# Patient Record
Sex: Male | Born: 1945 | Race: White | Hispanic: No | Smoking: Former smoker
Health system: Southern US, Community
[De-identification: ages and names within clinical notes are randomized; demographics above are authoritative.]

## PROBLEM LIST (undated history)

## (undated) ENCOUNTER — Emergency Department (HOSPITAL_COMMUNITY): Admission: EM | Payer: Medicare Other | Source: Home / Self Care

## (undated) DIAGNOSIS — I10 Essential (primary) hypertension: Secondary | ICD-10-CM

## (undated) DIAGNOSIS — M129 Arthropathy, unspecified: Secondary | ICD-10-CM

## (undated) DIAGNOSIS — I4949 Other premature depolarization: Secondary | ICD-10-CM

## (undated) DIAGNOSIS — K439 Ventral hernia without obstruction or gangrene: Principal | ICD-10-CM

## (undated) DIAGNOSIS — M1990 Unspecified osteoarthritis, unspecified site: Secondary | ICD-10-CM

## (undated) DIAGNOSIS — J4489 Other specified chronic obstructive pulmonary disease: Secondary | ICD-10-CM

## (undated) DIAGNOSIS — J449 Chronic obstructive pulmonary disease, unspecified: Secondary | ICD-10-CM

## (undated) DIAGNOSIS — K219 Gastro-esophageal reflux disease without esophagitis: Secondary | ICD-10-CM

## (undated) DIAGNOSIS — I2510 Atherosclerotic heart disease of native coronary artery without angina pectoris: Secondary | ICD-10-CM

## (undated) DIAGNOSIS — E785 Hyperlipidemia, unspecified: Secondary | ICD-10-CM

## (undated) DIAGNOSIS — I252 Old myocardial infarction: Secondary | ICD-10-CM

## (undated) HISTORY — DX: Hyperlipidemia, unspecified: E78.5

## (undated) HISTORY — DX: Other specified chronic obstructive pulmonary disease: J44.89

## (undated) HISTORY — DX: Other premature depolarization: I49.49

## (undated) HISTORY — DX: Atherosclerotic heart disease of native coronary artery without angina pectoris: I25.10

## (undated) HISTORY — DX: Arthropathy, unspecified: M12.9

## (undated) HISTORY — DX: Essential (primary) hypertension: I10

## (undated) HISTORY — DX: Ventral hernia without obstruction or gangrene: K43.9

## (undated) HISTORY — PX: HERNIA REPAIR: SHX51

## (undated) HISTORY — PX: APPENDECTOMY: SHX54

## (undated) HISTORY — PX: CORONARY ANGIOPLASTY: SHX604

## (undated) HISTORY — DX: Old myocardial infarction: I25.2

## (undated) HISTORY — DX: Chronic obstructive pulmonary disease, unspecified: J44.9

---

## 1997-10-17 ENCOUNTER — Emergency Department (HOSPITAL_COMMUNITY): Admission: EM | Admit: 1997-10-17 | Discharge: 1997-10-17 | Payer: Self-pay | Admitting: Emergency Medicine

## 2000-07-08 ENCOUNTER — Encounter: Payer: Self-pay | Admitting: Emergency Medicine

## 2000-07-08 ENCOUNTER — Emergency Department (HOSPITAL_COMMUNITY): Admission: EM | Admit: 2000-07-08 | Discharge: 2000-07-08 | Payer: Self-pay | Admitting: Emergency Medicine

## 2001-06-18 ENCOUNTER — Encounter: Payer: Self-pay | Admitting: Emergency Medicine

## 2001-06-18 ENCOUNTER — Inpatient Hospital Stay (HOSPITAL_COMMUNITY): Admission: EM | Admit: 2001-06-18 | Discharge: 2001-06-20 | Payer: Self-pay | Admitting: Emergency Medicine

## 2001-06-28 ENCOUNTER — Encounter: Payer: Self-pay | Admitting: Emergency Medicine

## 2001-06-28 ENCOUNTER — Inpatient Hospital Stay (HOSPITAL_COMMUNITY): Admission: EM | Admit: 2001-06-28 | Discharge: 2001-07-02 | Payer: Self-pay | Admitting: Emergency Medicine

## 2001-07-06 ENCOUNTER — Emergency Department (HOSPITAL_COMMUNITY): Admission: EM | Admit: 2001-07-06 | Discharge: 2001-07-06 | Payer: Self-pay | Admitting: Emergency Medicine

## 2001-09-12 ENCOUNTER — Emergency Department (HOSPITAL_COMMUNITY): Admission: EM | Admit: 2001-09-12 | Discharge: 2001-09-12 | Payer: Self-pay | Admitting: Emergency Medicine

## 2003-11-25 ENCOUNTER — Encounter: Admission: RE | Admit: 2003-11-25 | Discharge: 2003-11-25 | Payer: Self-pay | Admitting: Internal Medicine

## 2006-03-27 HISTORY — PX: PTCA: SHX146

## 2006-06-24 ENCOUNTER — Emergency Department (HOSPITAL_COMMUNITY): Admission: EM | Admit: 2006-06-24 | Discharge: 2006-06-24 | Payer: Self-pay | Admitting: Gastroenterology

## 2007-03-02 ENCOUNTER — Inpatient Hospital Stay (HOSPITAL_COMMUNITY): Admission: EM | Admit: 2007-03-02 | Discharge: 2007-03-06 | Payer: Self-pay | Admitting: Family Medicine

## 2007-03-02 ENCOUNTER — Ambulatory Visit: Payer: Self-pay | Admitting: Internal Medicine

## 2007-04-05 ENCOUNTER — Ambulatory Visit: Payer: Self-pay | Admitting: Internal Medicine

## 2007-04-05 ENCOUNTER — Ambulatory Visit: Payer: Self-pay | Admitting: Cardiology

## 2007-04-05 LAB — CONVERTED CEMR LAB
BUN: 12 mg/dL (ref 6–23)
CO2: 28 meq/L (ref 19–32)
Calcium: 9 mg/dL (ref 8.4–10.5)
Creatinine, Ser: 0.7 mg/dL (ref 0.4–1.2)
Glucose, Bld: 99 mg/dL (ref 70–99)
Potassium: 3.7 meq/L (ref 3.5–5.1)

## 2007-04-23 ENCOUNTER — Ambulatory Visit: Payer: Self-pay | Admitting: Cardiology

## 2007-05-13 ENCOUNTER — Ambulatory Visit: Payer: Self-pay | Admitting: Internal Medicine

## 2007-06-07 ENCOUNTER — Emergency Department (HOSPITAL_COMMUNITY): Admission: EM | Admit: 2007-06-07 | Discharge: 2007-06-07 | Payer: Self-pay | Admitting: Family Medicine

## 2007-06-21 ENCOUNTER — Ambulatory Visit: Payer: Self-pay | Admitting: Cardiology

## 2007-06-21 ENCOUNTER — Emergency Department (HOSPITAL_COMMUNITY): Admission: EM | Admit: 2007-06-21 | Discharge: 2007-06-21 | Payer: Self-pay | Admitting: Family Medicine

## 2007-08-28 ENCOUNTER — Inpatient Hospital Stay (HOSPITAL_COMMUNITY): Admission: EM | Admit: 2007-08-28 | Discharge: 2007-08-30 | Payer: Self-pay | Admitting: Emergency Medicine

## 2007-09-13 ENCOUNTER — Ambulatory Visit: Payer: Self-pay | Admitting: Internal Medicine

## 2007-09-16 ENCOUNTER — Ambulatory Visit: Payer: Self-pay | Admitting: *Deleted

## 2007-09-19 ENCOUNTER — Encounter: Payer: Self-pay | Admitting: Family Medicine

## 2007-09-19 ENCOUNTER — Ambulatory Visit: Payer: Self-pay | Admitting: Internal Medicine

## 2007-09-19 LAB — CONVERTED CEMR LAB
Albumin: 4 g/dL (ref 3.5–5.2)
BUN: 17 mg/dL (ref 6–23)
Calcium: 8.8 mg/dL (ref 8.4–10.5)
Chloride: 105 meq/L (ref 96–112)
Glucose, Bld: 96 mg/dL (ref 70–99)
HDL: 55 mg/dL (ref >39)
Potassium: 4.3 meq/L (ref 3.5–5.3)
Triglycerides: 160 mg/dL — ABNORMAL HIGH (ref <150)

## 2007-09-20 ENCOUNTER — Ambulatory Visit (HOSPITAL_COMMUNITY): Admission: RE | Admit: 2007-09-20 | Discharge: 2007-09-20 | Payer: Self-pay | Admitting: Family Medicine

## 2007-10-28 ENCOUNTER — Ambulatory Visit: Payer: Self-pay | Admitting: Family Medicine

## 2007-11-08 ENCOUNTER — Ambulatory Visit: Payer: Self-pay | Admitting: Cardiology

## 2008-01-02 ENCOUNTER — Ambulatory Visit: Payer: Self-pay | Admitting: Internal Medicine

## 2008-01-17 ENCOUNTER — Ambulatory Visit: Payer: Self-pay | Admitting: Family Medicine

## 2008-02-17 ENCOUNTER — Ambulatory Visit: Payer: Self-pay | Admitting: Internal Medicine

## 2008-02-17 ENCOUNTER — Encounter: Payer: Self-pay | Admitting: Family Medicine

## 2008-02-17 LAB — CONVERTED CEMR LAB
ALT: 23 units/L (ref 0–53)
AST: 16 units/L (ref 0–37)
Basophils Absolute: 0 10*3/uL (ref 0.0–0.1)
Basophils Relative: 0 % (ref 0–1)
CO2: 23 meq/L (ref 19–32)
Cholesterol: 154 mg/dL (ref 0–200)
Creatinine, Ser: 0.9 mg/dL (ref 0.40–1.50)
Eosinophils Relative: 1 % (ref 0–5)
HCT: 43.6 % (ref 39.0–52.0)
Hemoglobin: 13.9 g/dL (ref 13.0–17.0)
MCHC: 31.9 g/dL (ref 30.0–36.0)
MCV: 89.9 fL (ref 78.0–100.0)
Monocytes Absolute: 0.7 10*3/uL (ref 0.1–1.0)
RDW: 15 % (ref 11.5–15.5)
Total Bilirubin: 0.3 mg/dL (ref 0.3–1.2)
Total CHOL/HDL Ratio: 3
VLDL: 28 mg/dL (ref 0–40)

## 2008-02-28 ENCOUNTER — Ambulatory Visit: Payer: Self-pay | Admitting: Internal Medicine

## 2008-03-04 ENCOUNTER — Ambulatory Visit: Payer: Self-pay | Admitting: Cardiology

## 2008-03-04 ENCOUNTER — Ambulatory Visit: Payer: Self-pay | Admitting: Cardiovascular Disease

## 2008-03-04 DIAGNOSIS — I10 Essential (primary) hypertension: Secondary | ICD-10-CM | POA: Insufficient documentation

## 2008-03-04 DIAGNOSIS — I2510 Atherosclerotic heart disease of native coronary artery without angina pectoris: Secondary | ICD-10-CM | POA: Insufficient documentation

## 2008-03-04 DIAGNOSIS — E785 Hyperlipidemia, unspecified: Secondary | ICD-10-CM

## 2008-05-07 ENCOUNTER — Ambulatory Visit: Payer: Self-pay | Admitting: Internal Medicine

## 2008-05-25 ENCOUNTER — Ambulatory Visit: Payer: Self-pay | Admitting: Family Medicine

## 2008-06-23 ENCOUNTER — Ambulatory Visit: Payer: Self-pay | Admitting: Cardiology

## 2008-08-27 ENCOUNTER — Telehealth (INDEPENDENT_AMBULATORY_CARE_PROVIDER_SITE_OTHER): Payer: Self-pay | Admitting: *Deleted

## 2008-09-04 ENCOUNTER — Telehealth: Payer: Self-pay | Admitting: Internal Medicine

## 2008-10-26 ENCOUNTER — Ambulatory Visit: Payer: Self-pay | Admitting: Internal Medicine

## 2008-10-29 ENCOUNTER — Ambulatory Visit: Payer: Self-pay | Admitting: Cardiology

## 2008-11-13 ENCOUNTER — Emergency Department (HOSPITAL_COMMUNITY): Admission: EM | Admit: 2008-11-13 | Discharge: 2008-11-14 | Payer: Self-pay | Admitting: Emergency Medicine

## 2008-11-18 DIAGNOSIS — Z9089 Acquired absence of other organs: Secondary | ICD-10-CM

## 2008-11-18 DIAGNOSIS — I4949 Other premature depolarization: Secondary | ICD-10-CM

## 2008-11-18 DIAGNOSIS — I252 Old myocardial infarction: Secondary | ICD-10-CM

## 2008-11-18 DIAGNOSIS — J449 Chronic obstructive pulmonary disease, unspecified: Secondary | ICD-10-CM

## 2008-11-18 DIAGNOSIS — M129 Arthropathy, unspecified: Secondary | ICD-10-CM | POA: Insufficient documentation

## 2008-11-18 DIAGNOSIS — Z9861 Coronary angioplasty status: Secondary | ICD-10-CM

## 2008-12-01 ENCOUNTER — Encounter: Payer: Self-pay | Admitting: Internal Medicine

## 2008-12-07 ENCOUNTER — Telehealth (INDEPENDENT_AMBULATORY_CARE_PROVIDER_SITE_OTHER): Payer: Self-pay | Admitting: *Deleted

## 2008-12-10 ENCOUNTER — Telehealth (INDEPENDENT_AMBULATORY_CARE_PROVIDER_SITE_OTHER): Payer: Self-pay | Admitting: *Deleted

## 2008-12-14 ENCOUNTER — Telehealth: Payer: Self-pay | Admitting: Internal Medicine

## 2009-01-21 ENCOUNTER — Ambulatory Visit: Payer: Self-pay | Admitting: Internal Medicine

## 2009-01-21 ENCOUNTER — Encounter: Payer: Self-pay | Admitting: Internal Medicine

## 2009-01-21 ENCOUNTER — Encounter: Payer: Self-pay | Admitting: Family Medicine

## 2009-01-21 LAB — CONVERTED CEMR LAB
Albumin: 3.8 g/dL (ref 3.5–5.2)
BUN: 9 mg/dL (ref 6–23)
Basophils Absolute: 0 10*3/uL (ref 0.0–0.1)
Basophils Relative: 0 % (ref 0–1)
CO2: 23 meq/L (ref 19–32)
Calcium: 8.9 mg/dL (ref 8.4–10.5)
Chloride: 109 meq/L (ref 96–112)
Cholesterol: 152 mg/dL (ref 0–200)
Creatinine, Ser: 0.91 mg/dL (ref 0.40–1.50)
Eosinophils Absolute: 0.1 10*3/uL (ref 0.0–0.7)
Eosinophils Relative: 2 % (ref 0–5)
Glucose, Bld: 100 mg/dL — ABNORMAL HIGH (ref 70–99)
HCT: 43.8 % (ref 39.0–52.0)
HDL: 58 mg/dL (ref >39)
Hemoglobin: 13.7 g/dL (ref 13.0–17.0)
MCHC: 31.3 g/dL (ref 30.0–36.0)
MCV: 92.2 fL (ref 78.0–100.0)
Monocytes Absolute: 0.7 10*3/uL (ref 0.1–1.0)
Neutro Abs: 3.6 10*3/uL (ref 1.7–7.7)
RDW: 16.3 % — ABNORMAL HIGH (ref 11.5–15.5)
Total CHOL/HDL Ratio: 2.6

## 2009-01-28 ENCOUNTER — Encounter: Payer: Self-pay | Admitting: Internal Medicine

## 2009-02-22 ENCOUNTER — Encounter: Payer: Self-pay | Admitting: Family Medicine

## 2009-02-22 ENCOUNTER — Ambulatory Visit: Payer: Self-pay | Admitting: Internal Medicine

## 2009-02-26 ENCOUNTER — Encounter (INDEPENDENT_AMBULATORY_CARE_PROVIDER_SITE_OTHER): Payer: Self-pay | Admitting: *Deleted

## 2009-02-26 ENCOUNTER — Ambulatory Visit: Payer: Self-pay | Admitting: Cardiology

## 2009-02-26 ENCOUNTER — Ambulatory Visit: Payer: Self-pay | Admitting: Internal Medicine

## 2009-03-10 ENCOUNTER — Telehealth: Payer: Self-pay | Admitting: Internal Medicine

## 2009-03-11 ENCOUNTER — Encounter: Payer: Self-pay | Admitting: Internal Medicine

## 2009-03-12 ENCOUNTER — Telehealth: Payer: Self-pay | Admitting: Internal Medicine

## 2009-04-01 ENCOUNTER — Ambulatory Visit: Payer: Self-pay | Admitting: Internal Medicine

## 2009-05-03 ENCOUNTER — Encounter: Payer: Self-pay | Admitting: Internal Medicine

## 2009-05-03 ENCOUNTER — Ambulatory Visit: Payer: Self-pay | Admitting: Internal Medicine

## 2009-05-07 ENCOUNTER — Telehealth: Payer: Self-pay | Admitting: Physician Assistant

## 2009-05-13 ENCOUNTER — Telehealth: Payer: Self-pay | Admitting: Internal Medicine

## 2009-05-14 ENCOUNTER — Ambulatory Visit: Payer: Self-pay | Admitting: Family Medicine

## 2009-05-17 ENCOUNTER — Ambulatory Visit (HOSPITAL_COMMUNITY): Admission: RE | Admit: 2009-05-17 | Discharge: 2009-05-17 | Payer: Self-pay | Admitting: Internal Medicine

## 2009-06-07 ENCOUNTER — Telehealth: Payer: Self-pay | Admitting: Internal Medicine

## 2009-06-16 ENCOUNTER — Ambulatory Visit: Payer: Self-pay | Admitting: Internal Medicine

## 2009-06-25 ENCOUNTER — Ambulatory Visit: Payer: Self-pay | Admitting: Cardiology

## 2009-08-27 ENCOUNTER — Telehealth: Payer: Self-pay | Admitting: Internal Medicine

## 2009-08-30 ENCOUNTER — Encounter: Payer: Self-pay | Admitting: Internal Medicine

## 2009-10-28 ENCOUNTER — Ambulatory Visit: Payer: Self-pay | Admitting: Cardiology

## 2009-11-09 ENCOUNTER — Telehealth: Payer: Self-pay | Admitting: Internal Medicine

## 2009-11-11 ENCOUNTER — Encounter: Payer: Self-pay | Admitting: Internal Medicine

## 2010-01-06 ENCOUNTER — Ambulatory Visit: Payer: Self-pay | Admitting: Internal Medicine

## 2010-01-06 ENCOUNTER — Telehealth: Payer: Self-pay | Admitting: Internal Medicine

## 2010-01-20 ENCOUNTER — Telehealth (INDEPENDENT_AMBULATORY_CARE_PROVIDER_SITE_OTHER): Payer: Self-pay | Admitting: *Deleted

## 2010-02-02 ENCOUNTER — Ambulatory Visit: Payer: Self-pay | Admitting: Pulmonary Disease

## 2010-02-18 ENCOUNTER — Telehealth: Payer: Self-pay | Admitting: Pulmonary Disease

## 2010-02-22 ENCOUNTER — Encounter: Payer: Self-pay | Admitting: Internal Medicine

## 2010-02-22 ENCOUNTER — Ambulatory Visit: Payer: Self-pay | Admitting: Cardiology

## 2010-03-01 ENCOUNTER — Telehealth: Payer: Self-pay | Admitting: Internal Medicine

## 2010-03-04 ENCOUNTER — Ambulatory Visit: Payer: Self-pay | Admitting: Pulmonary Disease

## 2010-03-16 ENCOUNTER — Telehealth: Payer: Self-pay | Admitting: Pulmonary Disease

## 2010-03-27 ENCOUNTER — Inpatient Hospital Stay (HOSPITAL_COMMUNITY)
Admission: EM | Admit: 2010-03-27 | Discharge: 2010-03-28 | Payer: Self-pay | Source: Home / Self Care | Attending: Internal Medicine | Admitting: Internal Medicine

## 2010-04-15 ENCOUNTER — Ambulatory Visit
Admission: RE | Admit: 2010-04-15 | Discharge: 2010-04-15 | Payer: Self-pay | Source: Home / Self Care | Attending: Internal Medicine | Admitting: Internal Medicine

## 2010-04-17 ENCOUNTER — Encounter: Payer: Self-pay | Admitting: Family Medicine

## 2010-04-22 NOTE — Discharge Summary (Signed)
  NAMERAFE, MACKOWSKI NO.:  [id number]  MEDICAL RECORD NO.:  [id number]          PATIENT TYPE:  INP  LOCATION:  2038                         FACILITY:  [facility]  PHYSICIAN:  Pricilla Riffle, MD, FACCDATE OF BIRTH:  1945-12-20  DATE OF ADMISSION:  03/27/2010 DATE OF DISCHARGE:  03/28/2010                              DISCHARGE SUMMARY   PROCEDURES:  Two-view chest x-ray.  PRIMARY FINAL DISCHARGE DIAGNOSIS:  Chest pain.  SECONDARY DIAGNOSES: 1. Status post non-ST-segment elevation myocardial infarction in 2008     with percutaneous transluminal coronary angioplasty and drug-     eluting stent to the right coronary artery. 2. Remote history of tobacco use. 3. Hypertension. 4. Dyslipidemia, on IMPROVE-IT study. 5. History of anemia. 6. Chronic obstructive pulmonary disease. 7. Family history of coronary artery disease.  TIME AT DISCHARGE:  32 minutes.  HOSPITAL COURSE:  [name] is a 65 year old male with a history of coronary artery disease.  He had chest pain and came to the hospital where he was admitted for further evaluation.  His cardiac enzymes were negative for MI.  He had a nosebleed, so his Plavix was held for a few days and his aspirin was decreased to 81 mg daily.  A lipid profile showed an HDL of 58 and an LDL of 64.  He had some minimal elevation in the CK-MBs, but his troponins were within normal limits.  The CK-MB index was within normal limits as well.  A chest x-ray showed hyperexpansion, but no acute disease.  On March 28, 2009, [name] was seen by Dr. Dietrich Pates.  His symptoms had improved.  She considered him stable for discharge on March 28, 2010, to follow up as an outpatient.  DISCHARGE INSTRUCTIONS: 1. His activity level is to be increased gradually. 2. He is encouraged to stick to a low-sodium, heart-healthy diet. 3. He is to follow up with Dr. Tenny Craw and we will contact him with an     appointment. 4. He is to follow up  with [facility] and Dr. Vassie Loll as needed or as     scheduled.  DISCHARGE MEDICATIONS: 1. Three study drugs for cholesterol daily as directed. 2. Prednisone, taper completed and no longer on. 3. Lisinopril 20 mg a day. 4. Lopressor 25 mg b.i.d. 5. Amlodipine 5 mg b.i.d. 6. Sublingual nitroglycerin p.r.n. 7. Aspirin 325 mg is discontinued. 8. Aspirin 81 mg a day. 9. Plavix 75 mg daily, hold until Friday April 01, 2009, and then     restart.     Theodore Demark, PA-C   ______________________________ Pricilla Riffle, MD, [facility]    RB/MEDQ  D:  03/28/2010  T:  03/29/2010  Job:  [id number]  cc:   Oretha Milch, MD [facility]  Electronically Signed by Theodore Demark PA-C on 04/04/2010 03:54:28 PM Electronically Signed by Dietrich Pates MD [facility] on 04/22/2010 07:54:55 PM

## 2010-04-24 LAB — CONVERTED CEMR LAB
BUN: 9 mg/dL (ref 6–23)
Basophils Relative: 0.3 % (ref 0.0–3.0)
CO2: 29 meq/L (ref 19–32)
Chloride: 103 meq/L (ref 96–112)
Creatinine, Ser: 0.9 mg/dL (ref 0.4–1.5)
Eosinophils Absolute: 0.1 10*3/uL (ref 0.0–0.7)
HCT: 46.1 % (ref 39.0–52.0)
Lymphs Abs: 3.7 10*3/uL (ref 0.7–4.0)
MCHC: 33.3 g/dL (ref 30.0–36.0)
MCV: 92.8 fL (ref 78.0–100.0)
Monocytes Absolute: 0.7 10*3/uL (ref 0.1–1.0)
Neutrophils Relative %: 51.4 % (ref 43.0–77.0)
Platelets: 238 10*3/uL (ref 150.0–400.0)
Potassium: 4.3 meq/L (ref 3.5–5.1)
RBC: 4.97 M/uL (ref 4.22–5.81)

## 2010-04-26 NOTE — Progress Notes (Signed)
  Phone Note From Other Clinic   Summary of Call: Rec'd call from Dr. Tenny Craw re: patient's COPD. She felt like he needed to be set up for PFTs. Patient followed at [facility]. [facility]. to contact patient to arrange follow up appt. Initial call taken by: Brynda Rim,  May 07, 2009 3:03 PM

## 2010-04-26 NOTE — Progress Notes (Signed)
Summary: out of plavix  Phone Note Call from Patient Call back at Home Phone [phone]   Caller: Patient Reason for Call: Talk to Nurse Summary of Call: pt is out of plavix Initial call taken by: Migdalia Dk,  June 07, 2009 10:25 AM  Follow-up for Phone Call        Patient should stay on Plavix.   Follow-up by: Sherrill Raring, MD, [facility],  June 08, 2009 6:17 PM     Appended Document: out of plavix Called [facility] at [phone] and they will send medication to this office. No answer at this patient's home today.  Appended Document: out of plavix Plavix has arrived, no answer at patient's home....will try again.   Appended Document: out of plavix Patient aware...he will pick up Plavix today.

## 2010-04-26 NOTE — Progress Notes (Signed)
Summary: pt needs plavix  Phone Note Call from Patient Call back at Home Phone [phone]   Caller: Patient Reason for Call: Talk to Nurse, Talk to Doctor Summary of Call: pt was suppose to get Rx for plavix in mail and he has not gotten it yet and was calling to f/u on it.  Initial call taken by: Omer Jack,  March 01, 2010 12:42 PM  Follow-up for Phone Call        pt is will to pick meds at the office. if we have not send it to him. Follow-up by: Roe Coombs,  March 01, 2010 12:45 PM  Additional Follow-up for Phone Call Additional follow up Details #1::        pt states [name] was going to mail him a prescription last week for Plavix and he hasn't received it yet, explained mail is slow form here give it another couple of days, he states if he doesn't receive it he will call us back Meredith Staggers, RN  March 01, 2010 12:57 PM

## 2010-04-26 NOTE — Assessment & Plan Note (Signed)
Summary: per check out/sf   History of Present Illness: [name] is a 65 year old with a history of CAD (s/p NSTEMI in 2008 with PTCA/DES to RCA.   He currenly denies chest pains.   I saw him last in the fall.  AT that time i increased his Norvasc to two times a day to hep with bp control I had also wanted him to have PFTs done.  Unfort the physician he saw at [facility] is no longer working there.  He has not followed up.  Allergies: No Known Drug Allergies  Physical Exam  Lungs:  Decreased airflow.  Rare wheeze Additional Exam:  HEENT:  Normocephalic, atraumatic. EOMI, PERRLA.  Neck: JVP is normal. No thyromegaly. No bruits.  Lungs: Decreased airflow.  Rare wheeze. Heart: Regular rate and rhythm. Normal S1, S2. No S3.   No significant murmurs. PMI not displaced.  Abdomen:  Supple, nontender. Normal bowel sounds. No masses. No hepatomegaly.  Extremities:   Good distal pulses throughout. No lower extremity edema.  Musculoskeletal :moving all extremities.  Neuro:   alert and oriented x3.    Impression & Recommendations:  Problem # 1:  MYOCARDIAL INFARCTION, HX OF (ICD-412) Stable cardiac status.  continue meds.  Problem # 2:  HYPERTENSION, BENIGN (ICD-401.1) Better bp control  continue meds.  Problem # 3:  HYPERLIPIDEMIA-MIXED (ICD-272.4) Excellent control  Problem # 4:  COPD (ICD-496) Will try to contact [facility] re PFTs.

## 2010-04-26 NOTE — Progress Notes (Signed)
Summary: pt needs meds called in  Phone Note Refill Request Message from:  Patient on brystol meyers  Refills Requested: Medication #1:  PLAVIX 75 MG TABS 1 tab once daily. pt needs refill  Initial call taken by: Omer Jack,  August 27, 2009 10:59 AM  Follow-up for Phone Call        talked with Malachi Bonds at [facility]--reordered Plavix-should be to Dr Tenny Craw in 5-7 days--pt aware     Appended Document: pt needs meds called in spoke with pt, plavix from [facility] placed at the front desk for pick up

## 2010-04-26 NOTE — Assessment & Plan Note (Signed)
Summary: copd/jd   Visit Type:  Initial Consult Copy to:  Dietrich Pates Primary Atlee Villers/Referring Jowanda Heeg:  Dr Johny Blamer  CC:  Pulmonary consult with PFT's today.  History of Present Illness: 64/M, ex smoker for evaluation of COPD 26 PY smoker , quit 2008, when he had an MI c/o phlegm in upper airway  - white phlegm, mucinex does not help, occ wheeze No recurrent chest colds Can work in the yard x  20 mins, walk around the house or in the store Put on advair by [employer], taking once daily  -does not have 'rescue ' inhaler PFT showed FEv1 23% with good response to bronchodilator improving to 30%, air trapping & decreased diffusion c/w emphysema. He has been on lisinopril x 1 yr   Preventive Screening-Counseling & Management  Alcohol-Tobacco     Smoking Status: quit     Packs/Day: 2.0     Year Started: 1962     Year Quit: 2008  Current Medications (verified): 1)  Lisinopril 20 Mg Tabs (Lisinopril) .... Take 1 Tablet By Mouth Once A Day 2)  Metoprolol Tartrate 25 Mg Tabs (Metoprolol Tartrate) .Marland Kitchen.. 1 By Mouth Two Times A Day 3)  Study Drug .... Daily 4)  Aspirin 325 Mg  Tabs (Aspirin) .Marland Kitchen.. 1 By Mouth Dialy 5)  Norvasc 5 Mg Tabs (Amlodipine Besylate) .Marland Kitchen.. 1 Tablet 2 Times Per Day 6)  Advair Diskus 500-50 Mcg/dose Aepb (Fluticasone-Salmeterol) .Marland Kitchen.. 1 Puff Daily 7)  Plavix 75 Mg Tabs (Clopidogrel Bisulfate) .Marland Kitchen.. 1 Tab Once Daily  Allergies (verified): No Known Drug Allergies  Past History:  Past Medical History: Last updated: 11/18/2008 Current Problems:  ARTHRITIS (ICD-716.90) MYOCARDIAL INFARCTION, HX OF (ICD-412) PREMATURE VENTRICULAR CONTRACTIONS, FREQUENT (ICD-427.69) COPD (ICD-496) HYPERLIPIDEMIA-MIXED (ICD-272.4) HYPERTENSION, BENIGN (ICD-401.1) CAD, NATIVE VESSEL (ICD-414.01)    Past Surgical History: Last updated: 11/18/2008 APPENDECTOMY, HX OF (ICD-V45.79) PERCUTANEOUS TRANSLUMINAL CORONARY ANGIOPLASTY, HX OF (ICD-V45.82)  Social History: Divorced    Tobacco Use - Former.  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  The patient denies anorexia, fever, weight loss, weight gain, vision loss, decreased hearing, hoarseness, chest pain, syncope, peripheral edema, prolonged cough, headaches, hemoptysis, abdominal pain, melena, hematochezia, severe indigestion/heartburn, hematuria, muscle weakness, suspicious skin lesions, difficulty walking, depression, unusual weight change, abnormal bleeding, enlarged lymph nodes, and angioedema.    Vital Signs:  Patient profile:   65 year old male Height:      68 inches (172.72 cm) Weight:      177 pounds (80.45 kg) BMI:     27.01 O2 Sat:      97 % on Room air Temp:     97.6 degrees F (36.44 degrees C) oral Pulse rate:   77 / minute BP sitting:   112 / 78  (left arm) Cuff size:   regular  Vitals Entered By: Michel Bickers CMA (February 02, 2010 4:15 PM)  O2 Sat at Rest %:  97 O2 Flow:  Room air CC: Pulmonary consult with PFT's today Comments Medications reviewed with patient Michel Bickers CMA  February 02, 2010 4:16 PM   Physical Exam  Additional Exam:  Gen. Pleasant, well-nourished, in no distress, normal affect ENT - no lesions, no post nasal drip Neck: No JVD, no thyromegaly, no carotid bruits Lungs: no use of accessory muscles, no dullness to percussion, diffuse exp  rhonchi  Cardiovascular: Rhythm regular, heart sounds  normal, no murmurs or gallops, no peripheral edema Abdomen: soft and non-tender, no hepatosplenomegaly, BS normal. Musculoskeletal: No deformities, no cyanosis or clubbing Neuro:  alert, non focal     Impression & Recommendations:  Problem # 1:  COPD (ICD-496) Increase advair 500/50 1 puff two times a day  - given reversibility on PFTs Take rescue inhaler 2 puffs as needed ONLY Take prednisone tabs - short course x 2 weeks to check for  reversibility He is resistant to starting pulm rehab program  -Start exercise program  Problem # 2:  CAD, NATIVE VESSEL (ICD-414.01)  If bronchospasm persists , considre selective beta blockade & substitute of ARb instead of lisinopril - clearly these drugs are more important for his CAD His updated medication list for this problem includes:    Lisinopril 20 Mg Tabs (Lisinopril) .Marland Kitchen... Take 1 tablet by mouth once a day    Metoprolol Tartrate 25 Mg Tabs (Metoprolol tartrate) .Marland Kitchen... 1 by mouth two times a day    Aspirin 325 Mg Tabs (Aspirin) .Marland Kitchen... 1 by mouth dialy    Norvasc 5 Mg Tabs (Amlodipine besylate) .Marland Kitchen... 1 tablet 2 times per day    Plavix 75 Mg Tabs (Clopidogrel bisulfate) .Marland Kitchen... 1 tab once daily  Orders: Consultation Level IV (91478)  Medications Added to Medication List This Visit: 1)  Advair Diskus 500-50 Mcg/dose Aepb (Fluticasone-salmeterol) .Marland Kitchen.. 1 puff two times a day 2)  Prednisone 10 Mg Tabs (Prednisone) .... Take 2 tabs once daily with food x 7 days, then 1 tab once daily x 7days  Patient Instructions: 1)  Copy sent to: Dr Tenny Craw, Dr Tiburcio Pea 2)  Please schedule a follow-up appointment in 4 weeks. 3)  Increase advair 500/50 1 puff two times a day  4)  Take rescue inhaler 2 puffs as needed ONLY- sample 5)  Take prednisone tabs as directed 6)  Start exercise program Prescriptions: PREDNISONE 10 MG TABS (PREDNISONE) take 2 tabs once daily with food x 7 days, then 1 tab once daily x 7days  #21 x 0   Entered and Authorized by:   Comer Locket Vassie Loll MD   Signed by:   Comer Locket Vassie Loll MD on 02/02/2010   Method used:   Electronically to        [facility] [address] [phone][facility] (retail)       [address]       [place], [place]  [address]       Ph: [phone] or [phone]       Fax: [phone]   RxID:   [id number]

## 2010-04-26 NOTE — Miscellaneous (Signed)
  Clinical Lists Changes  Medications: Rx of PLAVIX 75 MG TABS (CLOPIDOGREL BISULFATE) 1 tab once daily;  #90 x 3;  Signed;  Entered by: Layne Benton, RN, BSN;  Authorized by: Sherrill Raring, MD, [facility];  Method used: Print then Give to Patient    Prescriptions: PLAVIX 75 MG TABS (CLOPIDOGREL BISULFATE) 1 tab once daily  #90 x 3   Entered by:   Layne Benton, RN, BSN   Authorized by:   Sherrill Raring, MD, [facility]   Signed by:   Layne Benton, RN, BSN on 02/22/2010   Method used:   Print then Give to Patient   RxID:   [id number]

## 2010-04-26 NOTE — Progress Notes (Signed)
Summary: refill on norvasc  Phone Note Refill Request Message from:  Patient on January 06, 2010 1:57 PM  Refills Requested: Medication #1:  NORVASC 5 MG TABS 1 tablet 2 times per day [BMN] pt [facility]. pt wants the nurse to [phone] and refill gernic of norvasc.  Initial call taken by: Roe Coombs,  January 06, 2010 1:56 PM  Follow-up for Phone Call        Called pharmacy and asked them to fill Norvasc with generic.  Layne Benton, RN, BSN  January 06, 2010 5:04 PM      Appended Document: refill on norvasc Patient aware of results.  Appended Document: refill on norvasc Called patient ...he is aware that [facility] and [facility] generic Norvasc...he will pick up tomorrow.

## 2010-04-26 NOTE — Miscellaneous (Signed)
Summary: Orders Update pft charges  Clinical Lists Changes  Orders: Added new Service order of Carbon Monoxide diffusing w/capacity (94720) - Signed Added new Service order of Lung Volumes (94240) - Signed Added new Service order of Spirometry (Pre & Post) (94060) - Signed 

## 2010-04-26 NOTE — Progress Notes (Signed)
Summary: Flu-like Symptoms  Phone Note Call from Patient Call back at Home Phone [phone]   Caller: Patient Reason for Call: Talk to Nurse Complaint: Cough/Sore throat, Headache, Nausea/Vomiting/Diarrhea Details for Reason: Per pt calling, c/o fluid build up x 3 days, no pcp was not contacted. if dr. Tenny Craw can called in a rx at drug store.  Initial call taken by: Lorne Skeens,  May 13, 2009 8:08 AM  Follow-up for Phone Call        I spoke with the pt and he is c/o low grade fever, nausea, vomiting, diarrhea, cough and chest congestion.  The pt wanted to know if Dr Tenny Craw would call in an antibiotic for his symptoms.  I told the pt that he most likely has the flu and needs to follow-up with his PCP.  The pt does have an appt with [facility] tomorrow.  I asked the pt to call [facility] and see if he could be seen today.  Pt agrees with plan.  Follow-up by: Julieta Gutting, RN, BSN,  May 13, 2009 8:23 AM

## 2010-04-26 NOTE — Medication Information (Signed)
Summary: [facility]   Imported By: Roderic Ovens 09/20/2009 08:51:51  _____________________________________________________________________  External Attachment:    Type:   Image     Comment:   External Document  Appended Document: [employer] [facility] Refill Called patient and advised him to pick up sample bottle of Plavix.

## 2010-04-26 NOTE — Progress Notes (Signed)
Summary: flu shot  Phone Note Call from Patient Call back at Home Phone [phone] Call back at [phone]   Caller: Patient Reason for Call: Talk to Nurse Details for Reason: Per pt calling, wants to know  if we are doing the flu clinic this year. Initial call taken by: Lorne Skeens,  November 09, 2009 10:07 AM  Follow-up for Phone Call        NA home phone. Cell phone incorrect number. Will try again. Pt call back [phone] Judie Grieve  November 09, 2009 2:35 PM Follow-up by: Suzan Garibaldi RN  Additional Follow-up for Phone Call Additional follow up Details #1::        Called patient NA. Additional Follow-up by: Suzan Garibaldi RN    Additional Follow-up for Phone Call Additional follow up Details #2::    Called patient back and advised him that we are not offering a flu shot clinic this year. He will get it from his primary care doctor. He asked that I refill his Plavix. Called [employer] and they will mail a new stock bottle to this office. He has 1 refill left and we will have to reapply the second week of November with a new application and financial statement.   Follow-up by: Suzan Garibaldi RN

## 2010-04-26 NOTE — Medication Information (Signed)
Summary: [facility] [employer] [facility]ill   [facility] Refill   Imported By: Roderic Ovens 11/23/2009 15:23:51  _____________________________________________________________________  External Attachment:    Type:   Image     Comment:   External Document  Appended Document: rov/sl Reviewed with research nurses.  Lipids are being followed closely as part of research protocol.  we are blinded but he is being followed so that LDL is at least less than 119.

## 2010-04-26 NOTE — Assessment & Plan Note (Signed)
Summary: [facility]   Visit Type:  Follow-up Primary Provider:  [facility]  CC:  no complaints.  History of Present Illness: [name] is a 65 year old with a history of CAD (s/p NSTEMI in 2008 iwth PTCA/DES to RCA).  Also a histoyr of hypertenison, dyslipidemia, COPD.  See second clinic note that was entered for 05/03/09  Current Medications (verified): 1)  Lisinopril 20 Mg Tabs (Lisinopril) .... Take 1 Tablet By Mouth Once A Day 2)  Metoprolol Tartrate 25 Mg Tabs (Metoprolol Tartrate) .Marland Kitchen.. 1 By Mouth Two Times A Day 3)  Study Drug .... Daily 4)  Aspirin 325 Mg  Tabs (Aspirin) .Marland Kitchen.. 1 By Mouth Dialy 5)  Oyster Calcium 500 Mg Tabs (Oyster Shell) .Marland Kitchen.. 1 By Mouth Daily 6)  Protonix 40 Mg Tbec (Pantoprazole Sodium) .Marland Kitchen.. 1 By Mouth Dialy 7)  Norvasc 5 Mg Tabs (Amlodipine Besylate) .Marland Kitchen.. 1 Tablet 2 Times Per Day 8)  Advair Diskus .... Daily 9)  Spiriva Inhaler .... Daily 10)  Plavix 75 Mg Tabs (Clopidogrel Bisulfate) .Marland Kitchen.. 1 Tab Once Daily  Allergies (verified): No Known Drug Allergies  Past History:  Past Medical History: Last updated: 11/18/2008 Current Problems:  ARTHRITIS (ICD-716.90) MYOCARDIAL INFARCTION, HX OF (ICD-412) PREMATURE VENTRICULAR CONTRACTIONS, FREQUENT (ICD-427.69) COPD (ICD-496) HYPERLIPIDEMIA-MIXED (ICD-272.4) HYPERTENSION, BENIGN (ICD-401.1) CAD, NATIVE VESSEL (ICD-414.01)    Vital Signs:  Patient profile:   65 year old male Height:      67 inches Weight:      182 pounds Pulse rate:   60 / minute BP sitting:   140 / 67  (left arm) Cuff size:   large  Vitals Entered By: Burnett Kanaris, CNA (May 03, 2009 9:36 AM)

## 2010-04-26 NOTE — Progress Notes (Signed)
Summary: BP READINGS  Phone Note Outgoing Call   Call placed by: Suzan Garibaldi RN  Follow-up for Phone Call        Patient reported that his BP on 10/18 was 144/80 and on 10/23 it was 124/66. Dr. Tenny Craw has advised that he does not need to make any medication changes. Patient is aware.  Layne Benton, RN, BSN  January 20, 2010 7:30 PM

## 2010-04-26 NOTE — Progress Notes (Signed)
Summary: ? letter for senior center  ---- Converted from flag ---- ---- 02/02/2010 5:19 PM, Comer Locket. Vassie Loll MD wrote: He can start & we will provide letter if needed  ---- 02/02/2010 4:43 PM, Zackery Barefoot CMA wrote: Pt states you told him that he will need a letter to start the senior citizen exercise program. ------------------------------  Phone Note Outgoing Call Call back at [facility] Phone [phone]   Summary of Call: ATC pt to inform of above,  no answer. Will sign off on this note wait on pt to call when/if  he needs a letter. Zackery Barefoot CMA  February 18, 2010 2:26 PM

## 2010-04-28 NOTE — Assessment & Plan Note (Signed)
Summary: rov/sl   Visit Type:  Follow-up Primary Provider:  Dr Johny Blamer  CC:  sob.  History of Present Illness: [name] is a 65 year old with a history of CAD (s/p NSTEMI in 2008 with PTCA/DES to RCA.   He currenly denies chest pains.   I saw him last in the fall.  AT that time i increased his Norvasc to two times a day to hep with bp control I had also wanted him to have PFTs done.  Unfort [facility] no [facility] working [facility].  He has not followed up. I saw him in February.  since seen he has noted on significant chest pains.  Breathing is about the same.  He still gets SOB with exertion.    Claims he just ran out of meds.  Took this AM Now has medicare.  Is not followed at [facility].  Current Medications (verified): 1)  Lisinopril 20 Mg Tabs (Lisinopril) .... Take 1 Tablet By Mouth Once A Day 2)  Metoprolol Tartrate 25 Mg Tabs (Metoprolol Tartrate) .Marland Kitchen.. 1 By Mouth Two Times A Day 3)  Study Drug .... Daily 4)  Aspirin 325 Mg  Tabs (Aspirin) .Marland Kitchen.. 1 By Mouth Dialy 5)  Norvasc 5 Mg Tabs (Amlodipine Besylate) .Marland Kitchen.. 1 Tablet 2 Times Per Day 6)  Advair Diskus .... Daily 7)  Plavix 75 Mg Tabs (Clopidogrel Bisulfate) .Marland Kitchen.. 1 Tab Once Daily  Allergies (verified): No Known Drug Allergies  Past History:  Past medical, surgical, family and social histories (including risk factors) reviewed, and no changes noted (except as noted below).  Past Medical History: Reviewed history from 11/18/2008 and no changes required. Current Problems:  ARTHRITIS (ICD-716.90) MYOCARDIAL INFARCTION, HX OF (ICD-412) PREMATURE VENTRICULAR CONTRACTIONS, FREQUENT (ICD-427.69) COPD (ICD-496) HYPERLIPIDEMIA-MIXED (ICD-272.4) HYPERTENSION, BENIGN (ICD-401.1) CAD, NATIVE VESSEL (ICD-414.01)    Past Surgical History: Reviewed history from 11/18/2008 and no changes required. APPENDECTOMY, HX OF (ICD-V45.79) PERCUTANEOUS TRANSLUMINAL CORONARY ANGIOPLASTY, HX OF  (ICD-V45.82)  Family History: Reviewed history from 03/04/2008 and no changes required. Mother:CABG in her 9's  Social History: Reviewed history from 03/04/2008 and no changes required. Divorced  Tobacco Use - Former.  Alcohol Use - no Regular Exercise - no  Review of Systems       All systems reviewed.  Neg to above.  Vital Signs:  Patient profile:   65 year old male Height:      67 inches Weight:      177 pounds BMI:     27.82 Pulse rate:   77 / minute BP sitting:   156 / 73  (left arm) Cuff size:   large  Vitals Entered By: Burnett Kanaris, CNA (January 06, 2010 10:19 AM)  Physical Exam  Additional Exam:  Patient is in NAD HEENT:  Normocephalic, atraumatic. EOMI, PERRLA.  Neck: JVP is normal. No thyromegaly. No bruits.  Lungs: Decreased airflow.  No rales.  Mild exp wheeze Heart: Regular rate and rhythm. Normal S1, S2. No S3.   No significant murmurs. PMI not displaced.  Abdomen:  Supple, nontender. Normal bowel sounds. No masses. No hepatomegaly.  Distended   Extremities:   Good distal pulses throughout. No lower extremity edema.  Musculoskeletal :moving all extremities.  Neuro:   alert and oriented x3.    EKG  Procedure date:  01/06/2010  Findings:      NSR.  PACs 79 bpm.  Impression & Recommendations:  Problem # 1:  PERCUTANEOUS TRANSLUMINAL CORONARY ANGIOPLASTY, HX OF (ICD-V45.82)  No symptoms to suggest ischemia Patient is s/p TAXUS stent to  RCA.  On review of note from procedure Dr. Juanda Chance notes a 50% narrowing in vessel just proximal to the proximal edge of stent.  Given this I would keep on plavix.  Problem # 2:  COPD (ICD-496) Will refer to pulmonary  Problem # 3:  HYPERLIPIDEMIA-MIXED (ICD-272.4) IN research protocol  Awaiting call back on labs.  Problem # 4:  HYPERTENSION, BENIGN (ICD-401.1) BP is not good today.  180/80 on my check   patient claims he has taken today.  Will refill.  Is getting cuff or taking it more often   If remains high  will  consider renal art eval.  (normal USN in 2009 of aorta.  Not vasc study) His updated medication list for this problem includes:    Lisinopril 20 Mg Tabs (Lisinopril) .Marland Kitchen... Take 1 tablet by mouth once a day    Metoprolol Tartrate 25 Mg Tabs (Metoprolol tartrate) .Marland Kitchen... 1 by mouth two times a day    Aspirin 325 Mg Tabs (Aspirin) .Marland Kitchen... 1 by mouth dialy    Norvasc 5 Mg Tabs (Amlodipine besylate) .Marland Kitchen... 1 tablet 2 times per day  Other Orders: EKG w/ Interpretation (93000) TLB-BMP (Basic Metabolic Panel-BMET) (80048-METABOL) TLB-CBC Platelet - w/Differential (85025-CBCD) Pulmonary Function Test (PFT) Pulmonary Referral (Pulmonary)  Patient Instructions: 1)  Your physician recommends that you return for lab work in: bmet and cbc today  We will call you with lab results. 2)  Your physician has recommended that you have a pulmonary function test.  Pulmonary Function Tests are a group of tests that measure how well air moves in and out of your lungs. 3)  You have been referred to Pulmonary Dept. 4)  Your physician recommends that you schedule a follow-up appointment in: 2 weeks for BP check with nurse 5)  Your physician wants you to follow-up in:  6 months with Dr.Oluwatobiloba Martin  You will receive a reminder letter in the mail two months in advance. If you don't receive a letter, please call our office to schedule the follow-up appointment. Prescriptions: NORVASC 5 MG TABS (AMLODIPINE BESYLATE) 1 tablet 2 times per day Brand medically necessary #60 x 3   Entered by:   Layne Benton, RN, BSN   Authorized by:   Sherrill Raring, MD, [facility]   Signed by:   Layne Benton, RN, BSN on 01/06/2010   Method used:   [facility] [address] [phone]* (retail)       [address]       [place], [place]  [address]       Ph: [phone] or [phone]       Fax: [phone]   RxID:   [id number] METOPROLOL TARTRATE 25 MG TABS (METOPROLOL TARTRATE) 1 by mouth two  times a day  #60 x 6   Entered by:   Layne Benton, RN, BSN   Authorized by:   Sherrill Raring, MD, [facility]   Signed by:   Layne Benton, RN, BSN on 01/06/2010   Method used:   [facility]ronically to        [facility] [address] [phone]* (retail)       [address]       [place], [place]  [address]       Ph: [phone] or [phone]  Fax: [phone]   RxID:   [id number] LISINOPRIL 20 MG TABS (LISINOPRIL) Take 1 tablet by mouth once a day  #30 x 6   Entered by:   Layne Benton, RN, BSN   Authorized by:   Sherrill Raring, MD, [facility]   Signed by:   Layne Benton, RN, BSN on 01/06/2010   Method used:   [facility]ronically to        [facility] [address] [phone]* (retail)       [address]       [place], [place]  [address]       Ph: [phone] or [phone]       Fax: [phone]   RxID:   [id number]   Appended Document: rov/sl Reviewed with research nurses.  Lipids are being followed closely as part of research protocol.  we are blinded but he is being followed so that LDL is at least less than 010.  Appended Document: rov/sl HAS F/U ON  04/15/10  @ 11:30./CY

## 2010-04-28 NOTE — Assessment & Plan Note (Addendum)
Summary: EPH   Visit Type:  Follow-up Referring Provider:  Dietrich Pates Primary Provider:  Dr Johny Blamer  CC:  no compliants.  History of Present Illness: [name] is a 65 year old with a history of CAD (s/p NSTEMI in 2008 with PTCA/DES to RCA.  He was last in [facility] in the fall.  He was recently discharged from [facility].  He was admitted with a nose bleed and complained of chest pain.  He ruled out for MI.  Plavix was held for a few  days.  He has humidified his home and used saline spray.  He has had no more bleeds.  SInce d/c he has done well.  He denies chest pain.  Breathnig is OK . No bloody nose  Problems Prior to Update: 1)  Arthritis  (ICD-716.90) 2)  Percutaneous Transluminal Coronary Angioplasty, Hx of  (ICD-V45.82) 3)  Myocardial Infarction, Hx of  (ICD-412) 4)  Appendectomy, Hx of  (ICD-V45.79) 5)  Premature Ventricular Contractions, Frequent  (ICD-427.69) 6)  COPD  (ICD-496) 7)  Hyperlipidemia-mixed  (ICD-272.4) 8)  Hypertension, Benign  (ICD-401.1) 9)  Cad, Native Vessel  (ICD-414.01)  Current Medications (verified): 1)  Lisinopril 20 Mg Tabs (Lisinopril) .... Take 1 Tablet By Mouth Once A Day 2)  Metoprolol Tartrate 25 Mg Tabs (Metoprolol Tartrate) .Marland Kitchen.. 1 By Mouth Two Times A Day 3)  Study Drug .... Three Tablets A Day 4)  Aspir-Low 81 Mg Tbec (Aspirin) .... Take 1 Tablet By Mouth Once A Day 5)  Norvasc 5 Mg Tabs (Amlodipine Besylate) .Marland Kitchen.. 1 Tablet 2 Times Per Day 6)  Advair Diskus 500-50 Mcg/dose Aepb (Fluticasone-Salmeterol) .Marland Kitchen.. 1 Puff Two Times A Day 7)  Plavix 75 Mg Tabs (Clopidogrel Bisulfate) .Marland Kitchen.. 1 Tab Once Daily  Allergies (verified): No Known Drug Allergies  Past History:  Family History: Last updated: 03/04/2008 Mother:CABG in her 90's  Social History: Last updated: 02/02/2010 Divorced  Tobacco Use - Former.  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  Past Medical History: Reviewed history from 11/18/2008 and no changes required. Current Problems:  ARTHRITIS (ICD-716.90) MYOCARDIAL INFARCTION, HX OF (ICD-412) PREMATURE VENTRICULAR CONTRACTIONS, FREQUENT (ICD-427.69) COPD (ICD-496) HYPERLIPIDEMIA-MIXED (ICD-272.4) HYPERTENSION, BENIGN (ICD-401.1) CAD, NATIVE VESSEL (ICD-414.01)    Past Surgical History: Reviewed history from 11/18/2008 and no changes required. APPENDECTOMY, HX OF (ICD-V45.79) PERCUTANEOUS TRANSLUMINAL CORONARY ANGIOPLASTY, HX OF (ICD-V45.82)  Family History: Reviewed history from 03/04/2008 and no changes required. Mother:CABG in her 19's  Social History: Reviewed history from 02/02/2010 and no changes required. Divorced  Tobacco Use - Former.  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  Neg to the above except as noted.  Vital Signs:  Patient profile:   65 year old male Height:      68 inches Weight:      175.50 pounds BMI:     26.78 Pulse rate:   44 / minute BP sitting:   116 / 68  (right arm) Cuff size:   regular  Vitals Entered By: Micki Riley CNA (April 15, 2010 11:16 AM)  Physical Exam  Additional Exam:  Patient is in NAD HEENT:  Normocephalic, atraumatic. EOMI, PERRLA.  Neck: JVP is normal. No thyromegaly. No bruits.  Lungs: clear to auscultation. No rales no wheezes.  Heart: Regular rate and rhythm. Normal S1, S2. No S3.   No significant murmurs. PMI not displaced.  Abdomen:  Supple, nontender. Normal bowel sounds. No masses. No hepatomegaly.  Extremities:   Good distal pulses throughout. No lower extremity edema.  Musculoskeletal :moving all extremities.    Neuro:   alert and oriented x3.    Impression & Recommendations:  Problem # 1:  CAD, NATIVE VESSEL (ICD-414.01) No evidence of ischemia.  COntinue meds.  Problem # 2:  HYPERTENSION, BENIGN (ICD-401.1) Continue meds. His updated medication list for this problem includes:    Lisinopril 20 Mg Tabs (Lisinopril) .Marland Kitchen... Take 1 tablet by mouth once a day    Metoprolol Tartrate 25 Mg Tabs (Metoprolol tartrate) .Marland Kitchen... 1 by mouth two times a day    Aspir-low 81 Mg Tbec (Aspirin) .Marland Kitchen... Take 1 tablet by mouth once a day    Norvasc 5 Mg Tabs (Amlodipine besylate) .Marland Kitchen... 1 tablet 2 times per day  Problem # 3:  HYPERLIPIDEMIA-MIXED (ICD-272.4) Continue study  Patient Instructions: 1)  Your physician wants you to follow-up in: Fall of 2012   You will receive a reminder letter in the mail two months in advance. If you don't receive a letter, please call our office to schedule the follow-up appointment. Prescriptions: NORVASC 5 MG TABS (AMLODIPINE BESYLATE) 1 tablet 2 times per day Brand medically necessary #60 x 11   Entered by:   Layne Benton, RN, BSN   Authorized by:   Sherrill Raring, MD, [facility]   Signed by:   Layne Benton, RN, BSN on 04/15/2010   Method used:   [facility] to        [facility] [address] [phone]* (retail)       [address]       [place], [place]  [address]       Ph: [phone] or [phone]       Fax: [phone]   RxID:   [id number]   Appended Document: EPH Called pharm and advised generic Norvasc OK with Dr.Alysa Duca and patient is aware.

## 2010-04-28 NOTE — Progress Notes (Signed)
----   Converted from flag ---- ---- 03/05/2010 9:07 AM, Comer Locket. Vassie Loll MD wrote: pl let him know - Unfortunately we do not have ongoing trials for him. We have one ongoing for lesser level of copd. we will keep him in mind if any come up ------------------------------  pt informed. Zackery Barefoot CMA  March 16, 2010 3:21 PM

## 2010-04-28 NOTE — Assessment & Plan Note (Signed)
Summary: 4 weeks/ mbw   Visit Type:  Follow-up Copy to:  Dietrich Pates Primary Provider/Referring Provider:  Dr Johny Blamer  CC:  Pt here for follow up. Pt c/o increased SOB with activity.  History of Present Illness: 65/M, ex smoker for FU of severe COPD - gold stg iV 46 PY smoker , quit 2008, when he had an MI Can work in the yard x  20 mins, walk around the house or in the store Put on advair by [employer], taking once daily  -does not have 'rescue ' inhaler PFT showed FEv1 23% with good response to bronchodilator improving to 30%, air trapping & decreased diffusion c/w emphysema. He has been on lisinopril x 1 yr  March 04, 2010 1:41 PM  Breathing better, wheezing gone with prednisone, still some dyspnea He is interested in trials if any ongoing, does not want to participate in rehab however, wants to do it himself, wants [place] to assume care for copd   Preventive Screening-Counseling & Management  Alcohol-Tobacco     Smoking Status: quit     Packs/Day: 2.0     Year Started: 1962     Year Quit: 2008  Current Medications (verified): 1)  Lisinopril 20 Mg Tabs (Lisinopril) .... Take 1 Tablet By Mouth Once A Day 2)  Metoprolol Tartrate 25 Mg Tabs (Metoprolol Tartrate) .Marland Kitchen.. 1 By Mouth Two Times A Day 3)  Study Drug .... Daily 4)  Aspirin 325 Mg  Tabs (Aspirin) .Marland Kitchen.. 1 By Mouth Dialy 5)  Norvasc 5 Mg Tabs (Amlodipine Besylate) .Marland Kitchen.. 1 Tablet 2 Times Per Day 6)  Advair Diskus 500-50 Mcg/dose Aepb (Fluticasone-Salmeterol) .Marland Kitchen.. 1 Puff Two Times A Day 7)  Plavix 75 Mg Tabs (Clopidogrel Bisulfate) .Marland Kitchen.. 1 Tab Once Daily  Allergies (verified): No Known Drug Allergies  Past History:  Past Medical History: Last updated: 11/18/2008 Current Problems:  ARTHRITIS (ICD-716.90) MYOCARDIAL INFARCTION, HX OF (ICD-412) PREMATURE VENTRICULAR CONTRACTIONS, FREQUENT (ICD-427.69) COPD (ICD-496) HYPERLIPIDEMIA-MIXED (ICD-272.4) HYPERTENSION, BENIGN (ICD-401.1) CAD, NATIVE VESSEL  (ICD-414.01)    Social History: Last updated: 02/02/2010 Divorced  Tobacco Use - Former.  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  The patient denies anorexia, fever, weight loss, weight gain, vision loss, decreased hearing, hoarseness, chest pain, syncope, peripheral edema, prolonged cough, headaches, hemoptysis, abdominal pain, melena, hematochezia, severe indigestion/heartburn, hematuria, muscle weakness, suspicious skin lesions, transient blindness, difficulty walking, depression, unusual weight change, abnormal bleeding, enlarged lymph nodes, and angioedema.    Vital Signs:  Patient profile:   65 year old male Height:      68 inches Weight:      182.2 pounds BMI:     27.80 O2 Sat:      95 % on Room air Temp:     98.2 degrees F oral Pulse rate:   74 / minute BP sitting:   132 / 66  (right arm) Cuff size:   large  Vitals Entered By: Zackery Barefoot CMA (March 04, 2010 1:33 PM)  O2 Flow:  Room air CC: Pt here for follow up. Pt c/o increased SOB with activity Comments Medications reviewed with patient Verified contact number and pharmacy with patient Zackery Barefoot CMA  March 04, 2010 1:34 PM    Physical Exam  Additional Exam:  Gen. Pleasant, well-nourished, in no distress, normal affect ENT - no lesions,  no post nasal drip Neck: No JVD, no thyromegaly, no carotid bruits Lungs: no use of accessory muscles, no dullness to percussion, decreaed bL. no rhonchi  Cardiovascular: Rhythm regular, heart sounds  normal, no murmurs or gallops, no peripheral edema Musculoskeletal: No deformities, no cyanosis or clubbing      Impression & Recommendations:  Problem # 1:  COPD (ICD-496) Bronchospasm resolved ct advair two times a day  Trial of spiriva He is not interested in pulm rehab but will start exercise program Unfortunately we do not have  ongoing trials for gold stg IV copd  Medications Added to Medication List This Visit: 1)  Advair Diskus 250-50 Mcg/dose Aepb (Fluticasone-salmeterol) .... Two times a day  Other Orders: Est. Patient Level III ([address]) Prescription Created Electronically [id number])  Patient Instructions: 1)  Copy sent to: Dr Tiburcio Pea  2)  Please schedule a follow-up appointment in 3 months with TP 3)  Advair Rx sent 4)  Recommend rehab program - otherise, you can start on an exercise program yourself Prescriptions: ADVAIR DISKUS 250-50 MCG/DOSE AEPB (FLUTICASONE-SALMETEROL) two times a day  #1 x 3   Entered and Authorized by:   Comer Locket Vassie Loll MD   Signed by:   Comer Locket Vassie Loll MD on 03/04/2010   Method used:   [facility] to        [facility] [address] [phone]* (retail)       [address]       [place], [place]  [address]       Ph: [phone] or [phone]       Fax: [phone]   RxID:   [id number]    Immunization History:  Influenza Immunization History:    Influenza:  historical (01/21/2010)

## 2010-06-02 ENCOUNTER — Ambulatory Visit: Payer: Self-pay | Admitting: Adult Health

## 2010-06-06 LAB — POCT CARDIAC MARKERS
CKMB, poc: 2.9 ng/mL (ref 1.0–8.0)
Myoglobin, poc: 151 ng/mL (ref 12–200)
Myoglobin, poc: 210 ng/mL (ref 12–200)

## 2010-06-06 LAB — CARDIAC PANEL(CRET KIN+CKTOT+MB+TROPI)
CK, MB: 4.2 ng/mL — ABNORMAL HIGH (ref 0.3–4.0)
CK, MB: 5.1 ng/mL — ABNORMAL HIGH (ref 0.3–4.0)
Relative Index: 1.7 (ref 0.0–2.5)
Total CK: 250 U/L — ABNORMAL HIGH (ref 7–232)

## 2010-06-06 LAB — DIFFERENTIAL
Basophils Absolute: 0 10*3/uL (ref 0.0–0.1)
Basophils Relative: 0 % (ref 0–1)
Eosinophils Absolute: 0.1 10*3/uL (ref 0.0–0.7)
Neutro Abs: 5.5 10*3/uL (ref 1.7–7.7)
Neutrophils Relative %: 56 % (ref 43–77)

## 2010-06-06 LAB — CBC
Hemoglobin: 13.9 g/dL (ref 13.0–17.0)
MCH: 30.2 pg (ref 26.0–34.0)
MCHC: 33.8 g/dL (ref 30.0–36.0)
Platelets: 202 10*3/uL (ref 150–400)
RDW: 14.5 % (ref 11.5–15.5)

## 2010-06-06 LAB — CK TOTAL AND CKMB (NOT AT ARMC)
CK, MB: 6.4 ng/mL (ref 0.3–4.0)
Relative Index: 2.2 (ref 0.0–2.5)
Total CK: 292 U/L — ABNORMAL HIGH (ref 7–232)

## 2010-06-06 LAB — BASIC METABOLIC PANEL
BUN: 11 mg/dL (ref 6–23)
CO2: 25 mEq/L (ref 19–32)
Calcium: 8.8 mg/dL (ref 8.4–10.5)
Creatinine, Ser: 1.02 mg/dL (ref 0.4–1.5)
GFR calc non Af Amer: >60 mL/min (ref >60)
Glucose, Bld: 153 mg/dL — ABNORMAL HIGH (ref 70–99)
Sodium: 140 mEq/L (ref 135–145)

## 2010-06-06 LAB — PROTIME-INR: INR: 0.92 (ref 0.00–1.49)

## 2010-06-06 LAB — LIPID PANEL
LDL Cholesterol: 64 mg/dL (ref 0–99)
Triglycerides: 83 mg/dL (ref <150)
VLDL: 17 mg/dL (ref 0–40)

## 2010-06-06 LAB — TROPONIN I: Troponin I: 0.02 ng/mL (ref 0.00–0.06)

## 2010-06-23 ENCOUNTER — Encounter (INDEPENDENT_AMBULATORY_CARE_PROVIDER_SITE_OTHER): Payer: Self-pay

## 2010-06-23 DIAGNOSIS — R0989 Other specified symptoms and signs involving the circulatory and respiratory systems: Secondary | ICD-10-CM

## 2010-06-27 ENCOUNTER — Ambulatory Visit: Payer: Self-pay | Admitting: Adult Health

## 2010-06-30 ENCOUNTER — Encounter: Payer: Self-pay | Admitting: Adult Health

## 2010-07-02 LAB — COMPREHENSIVE METABOLIC PANEL
ALT: 22 U/L (ref 0–53)
BUN: 10 mg/dL (ref 6–23)
CO2: 26 mEq/L (ref 19–32)
Calcium: 9 mg/dL (ref 8.4–10.5)
Creatinine, Ser: 1.03 mg/dL (ref 0.4–1.5)
GFR calc non Af Amer: >60 mL/min (ref >60)
Glucose, Bld: 120 mg/dL — ABNORMAL HIGH (ref 70–99)
Sodium: 140 mEq/L (ref 135–145)

## 2010-07-02 LAB — CBC
HCT: 39.5 % (ref 39.0–52.0)
Hemoglobin: 13.5 g/dL (ref 13.0–17.0)
MCHC: 34 g/dL (ref 30.0–36.0)
MCV: 89.1 fL (ref 78.0–100.0)
RBC: 4.44 MIL/uL (ref 4.22–5.81)

## 2010-07-02 LAB — URINALYSIS, ROUTINE W REFLEX MICROSCOPIC
Bilirubin Urine: NEGATIVE
Glucose, UA: NEGATIVE mg/dL
Hgb urine dipstick: NEGATIVE
Ketones, ur: NEGATIVE mg/dL
Specific Gravity, Urine: 1.011 (ref 1.005–1.030)
pH: 7 (ref 5.0–8.0)

## 2010-07-02 LAB — DIFFERENTIAL
Eosinophils Absolute: 0.1 10*3/uL (ref 0.0–0.7)
Lymphs Abs: 3.1 10*3/uL (ref 0.7–4.0)
Neutro Abs: 3.7 10*3/uL (ref 1.7–7.7)
Neutrophils Relative %: 48 % (ref 43–77)

## 2010-07-05 ENCOUNTER — Encounter: Payer: Self-pay | Admitting: Adult Health

## 2010-07-05 ENCOUNTER — Ambulatory Visit (INDEPENDENT_AMBULATORY_CARE_PROVIDER_SITE_OTHER): Payer: Medicare Other | Admitting: Adult Health

## 2010-07-05 DIAGNOSIS — J449 Chronic obstructive pulmonary disease, unspecified: Secondary | ICD-10-CM

## 2010-07-05 DIAGNOSIS — J4489 Other specified chronic obstructive pulmonary disease: Secondary | ICD-10-CM

## 2010-07-05 NOTE — Progress Notes (Signed)
  Subjective:    Patient ID: [name], male    DOB: [birth year], 64 y.o.   MRN: [id number]  HPI 64/M, ex smoker for FU of severe COPD - gold stg iV  46 PY smoker , quit 2008, when he had an MI  Can work in the yard x 20 mins, walk around the house or in the store  Put on advair by [employer], taking once daily -does not have 'rescue ' inhaler  PFT showed FEv1 23% with good response to bronchodilator improving to 30%, air trapping & decreased diffusion c/w emphysema.  He has been on lisinopril   March 04, 2010 1:41 PM  Breathing better, wheezing gone with prednisone, still some dyspnea     07/05/2010 Acute OV Presents for an acute office visit. Breathing not as good over last month, more DOE,  Stopped Advair x 1 month believes it is causing eye problems. We discussed side effects of Advair. He has not seen any improvement in vision since stopping. We discussed him following up with eye doctor -last seen > 1year ago. No increased cough/congestion, discolored mucus, fever or leg swelling.    Review of Systems Constitutional:   No  weight loss, night sweats,  Fevers, chill  HEENT:   No headaches,  Difficulty swallowing,  Tooth/dental problems, or  Sore throat,                No sneezing, itching, ear ache, nasal congestion, post nasal drip,   CV:  No chest pain,  Orthopnea, PND, swelling in lower extremities, anasarca, dizziness, palpitations, syncope.   GI  No heartburn, indigestion, abdominal pain, nausea, vomiting, diarrhea, change in bowel habits, loss of appetite, bloody stools.   Resp:    No coughing up of blood.  No change in color of mucus.  No wheezing.  No chest wall deformity  Skin: no rash or lesions.  GU: no dysuria, change in color of urine, no urgency or frequency.  No flank pain, no hematuria   MS:  No joint pain or swelling.  No decreased range of motion.  No back pain.  Psych:  No change in mood or affect. No depression or anxiety.  No memory loss.      Objective:   Physical Exam GEN: A/Ox3; pleasant , NAD, chronically ill appearing.   HEENT:  [place]/AT,  EACs-clear, TMs-wnl, NOSE-clear, THROAT-clear, no lesions, no postnasal drip or exudate noted.   NECK:  Supple w/ fair ROM; no JVD; normal carotid impulses w/o bruits; no thyromegaly or nodules palpated; no lymphadenopathy.  RESP  diminshed BS in bases , no wheezing   CARD:  RRR, no m/r/g  , no peripheral edema, pulses intact, no cyanosis or clubbing.  GI:   Soft & nt; nml bowel sounds; no organomegaly or masses detected.  Musco: Warm bil, no deformities or joint swelling noted.   Neuro: alert, no focal deficits noted.    Skin: Warm, no lesions or rashes        Assessment & Plan:

## 2010-07-05 NOTE — Assessment & Plan Note (Signed)
Decline in activity tolerance with more DOE off Advair He has agreed to restart Advair.  Suggested to start Spiriva however he says he can not afford both.  follow up Dr. Vassie Loll  In 3 months follow up opthamology for eye exam

## 2010-07-05 NOTE — Patient Instructions (Signed)
Restart Advair Twice daily  , Brush/rinse and gargle after use.  Follow up with Eye doctor to have eye looked at.  follow up Dr. Vassie Loll  In 2-3 months  And As needed

## 2010-08-09 NOTE — Assessment & Plan Note (Signed)
[facility]                            CARDIOLOGY OFFICE NOTE   NAME:Bressman, [name]                    MRN:          [id number]  DATE:05/07/2008                            DOB:          1945-09-27    IDENTIFICATION:  [name] is a 65 year old gentleman.  He has a  history of CAD, status post NSTEMI in December 2008.  He underwent  PTCA/DES to the RCA for a 95% lesion.  Proximal LAD had some  calcifications.  Circumflex was normal.  I last saw him in February of  last year.   In the interval, he has done fairly well.  He denies arm pain, which is  his anginal equivalent.  Breathing is unchanged.  He takes activities as  tolerated.  He is walking on the treadmill a couple of times per day.   CURRENT MEDICINES:  1. Protonix 40.  2. Advair Diskus.  3. Spiriva.  4. Aspirin 325.  5. Metoprolol 25 b.i.d.  6. Lisinopril 20.  7. Plavix 75.  8. Cholesterol study drug.   PHYSICAL EXAMINATION:  GENERAL:  On exam, the patient is in no distress  at rest.  VITAL SIGNS:  Blood pressure 158/67 on my check 160/90, pulse 68 and  regular, weight is 186, up 3 pounds from previous.  NECK:  No bruits.  No JVD.  LUNGS:  Clear with some decreased air flow.  No rales.  CARDIAC:  Regular rate and rhythm.  S1 and S2.  No S3.  No significant  murmurs.  ABDOMEN:  Slightly distended.  No palpable masses.  Nontender.  EXTREMITIES:  Good distal pulses.  No lower extremity edema.   A 12-lead EKG shows normal sinus rhythm at 67 beats per minute.   IMPRESSION:  1. Coronary artery disease.  He appears to be doing well clinically.      I would keep him on the same regimen including the Plavix.  2. Hypertension, not optimally controlled today.  This is the only      time he has had this.  I have given him prescription for Norvasc      2.5.  If his blood pressure is up and he goes to all [facility] in      a few weeks, he should start on this.  I will see him back in  May      to reassess.  I do not think he would tolerate increasing his beta-      blocker.  I think with the breathing and with his heart rate, we      will make him feel more sluggish.  Continue on lisinopril and      metoprolol.  3. Dyslipidemia, in study.   I congratulated the patient on smoking cessation.  Also, I concurred him  to continue to stay active.   ADDENDUM:  Abdominal ultrasound done at [facility] in June of last year shows  aorta measures 2.2 cm.  There was some evidence of hepatic steatosis,  and some mild thickening of the gallbladder, but no cholecystitis.     Pricilla Riffle, MD, [facility]  Electronically Signed    PVR/MedQ  DD: 05/07/2008  DT: 05/07/2008  Job #: [id number]   cc:   Sharin Grave, MD @ [facility]

## 2010-08-09 NOTE — Discharge Summary (Signed)
NAMEMEREK, NIU NO.:  [id number]   MEDICAL RECORD NO.:  [id number]          PATIENT TYPE:  INP   LOCATION:  2904                         FACILITY:  [facility]   PHYSICIAN:  Pricilla Riffle, MD, FACCDATE OF BIRTH:  November 06, 1945   DATE OF ADMISSION:  03/02/2007  DATE OF DISCHARGE:  03/06/2007                               DISCHARGE SUMMARY   PROCEDURES PERFORMED DURING HOSPITALIZATION:  Cardiac catheterization by  Dr. Tonny Bollman, dated March 04, 2007, revealing proximal LAD  calcification, circumflex normal, RCA 95% mid LV function, inferior  hypokinesis.  Status post Taxus Liberty drug eluting stent, 2.5 x 24,  reducing RCA stenosis from 95% to 0% stenosis.   FINAL DISCHARGE DIAGNOSES:  1. A 2.5 by 24 Taxus plus 3 x 20 non-ST elevated myocardial      infarction.      a.     Status post percutaneous coronary intervention of the right       coronary artery with Taxus drug eluting stent.  2. Hypertension.  3. Dyslipidemia (on IMPROVIT study).  4. Anemia.  5. Tobacco abuse.  6. Chronic obstructive pulmonary disease.   HOSPITAL COURSE:  This is a 65 year old Caucasian male, who presented to  urgent care with complaints of arm pain.  He woke up that morning and  had a couple of hot dogs around 11 a.m., was not doing anything when he  developed severe left-arm pain, 8/10 at its worst.  He came to urgent  care.  EKG was done, which showed significant ST depression in the  anterolateral leads, and he was transferred to [facility] ER.  The  patient also complained of some mild shortness of breath, but had had no  chest pain.  The patient was admitted.  He was seen by Dietrich Pates in the  emergency room.  He was given 4 mg of morphine and symptoms improved.  He was started on heparin and Integrilin and received aspirin.  The  patient was recommended for cardiac catheterization, secondary to EKG  changes and symptoms to define coronary anatomy.   The patient was  admitted to ICU with diagnosis of non-ST-elevated MI,  with also a history of COPD.  He was seen overnight by Dr. Arvilla Meres and planned for cardiac catheterization the following day.   Cardiac catheterization was completed on March 04, 2007, by Dr. Charlies Constable with results as described.  Please see Dr. Regino Schultze thorough  cardiac catheterization note and intervention note for more details.  The patient did well post-catheterization, without evidence of hematoma,  bleeding, infection or arrhythmias.  The patient also was counseled on  smoking cessation and the patient stated that he quit smoking  approximately one year ago.   The patient was found to be mildly anemic with a hemoglobin of 11.2.  The patient had no bleeding nor swelling in his scrotum.  There was no  further evidence of bleeding or swelling noted throughout the rest of  the hospitalization.   On March 06, 2007, the patient was seen and examined by Dr. Tonny Bollman and found to be  stable for discharge.  He was seen by social  services and was given a 14-day supply of Plavix and other medications  written were for generics and can be found at [facility].  The patient has  also been enrolled in the IMPROVIT study for dyslipidemia and has been  given study drug.  The patient will follow up with Dr. Dietrich Pates within  three weeks for continued cardiac management and patient has been  advised to call for any recurrence of symptoms.   DISCHARGE LABS:  Hemoglobin 12, hematocrit 36, white blood cells 9.5,  platelets 251.  BMET:  Sodium 131, potassium 4.2, chloride 108, CO2 26,  glucose 98, BUN 7, creatinine 0.84.  CK 392, MB 13.9, hemoglobin A1c  6.5, TSH 2.747.   EKG:  Normal sinus rhythm with ST depression noted inferiorly and in  leads V3 and V4.   DISCHARGE MEDICATIONS:  1. Implement study drugs with three-month supply given.  2. Aspirin 325 daily.  3. Colace 200 mg daily.  4. Spiriva 18 micrograms  daily.  5. Plavix 75 mg daily.  6. Toprol XL 50 mg daily.  7. Lisinopril 5 mg daily.  8. Nitroglycerin spray on tongue p.r.n. recurrence of chest pain.   ALLERGIES:  No known drug allergies.   FOLLOWUP PLANS AND APPOINTMENTS:  1. The patient is to be seen by Dr. Dietrich Pates January 9 at 3:15 p.m.  2. The patient has been given post- cardiac catheterization      instructions with particular emphasis on the right-groin site for      evidence of hematoma, bleeding, sign of infection or severe pain.   Time spent with the patient, to include physician time, 45 minutes.      Bettey Mare. Lyman Bishop, NP      Pricilla Riffle, MD, [facility]  Electronically Signed    KML/MEDQ  D:  03/06/2007  T:  03/06/2007  Job:  [id number]

## 2010-08-09 NOTE — Assessment & Plan Note (Signed)
[facility]                            CARDIOLOGY OFFICE NOTE   NAME:[name], [name]                    MRN:          [id number]  DATE:05/13/2007                            DOB:          1945/05/13    IDENTIFICATION:  The patient is a 65 year old gentleman who is status  post non-Q-wave MI back in December.  He comes in today for continued  care.   In the interval he has done well.  He says he notes occasional shock-  like sensation down his left finger.  He denies any arm or shoulder pain  which at that time he had his MI.  He has had problems with some  arthritis in his hands.  Otherwise he denies shortness of breath.  No  chest pain.  Again he did not have chest pain with his MI.  His current  medications include a study drug, Plavix 75, metoprolol 25 b.i.d.,  lisinopril 10 daily, and aspirin 81 mg 3 tablets daily.   PHYSICAL EXAM:  The patient is in no distress.  Blood pressure 132/68 pulse is 71, weight is 174, up 6 pounds.  Lungs are clear.  Cardiac exam regular rate and rhythm, S1-S2 no S3 no murmurs.  ABDOMEN:  Benign.  EXTREMITIES:  No edema.   12-lead EKG normal sinus rhythm with frequent PVCs.   IMPRESSION:  1. Coronary artery disease.  The patient is status post non-Q-wave MI      underwent PTCA/stent (drug-eluting stent) to the RCA that had a 95%      proximal lesion.  Proximal LAD had some calcifications. The      circumflex was normal.  My plan is to continue on medical care at      some point check an echocardiogram to evaluate his inferior wall.      Too early right now.  Continue on current regimen  2. Dyslipidemia in study protocol.  3. Health care maintenance.  Encouraged him to increase his walking      and try to get his weight down.  Again he is being followed on      lipid trial.  4. Hypertension, adequate control.   Continue Plavix for a year.     Pricilla Riffle, MD, [facility]  Electronically Signed    PVR/MedQ   DD: 05/16/2007  DT: 05/16/2007  Job #: [id number]

## 2010-08-09 NOTE — Cardiovascular Report (Signed)
[name], [name] NO.:  [id number]   MEDICAL RECORD NO.:  [id number]          PATIENT TYPE:  INP   LOCATION:  2904                         FACILITY:  [facility]   PHYSICIAN:  Everardo Beals. Juanda Chance, MD, FACCDATE OF BIRTH:  1945/12/30   DATE OF PROCEDURE:  03/04/2007  DATE OF DISCHARGE:                            CARDIAC CATHETERIZATION   CLINICAL HISTORY:  The patient is 65 years old and has no prior history  of known heart disease and has not seen a doctor in some time.  He is  retired from [employer] recently.  He was admitted to the hospital with  left severe arm pain and seen by Dr. Tenny Craw and admitted with the  diagnosis of probable unstable angina.  His enzymes were positive for a  non-ST elevation MI.   DESCRIPTION OF PROCEDURE:  The diagnostic procedure had been performed  earlier today and we had to postpone the intervention due to an  emergency.  The procedure was performed via the right femoral artery  using a JR4 6 [place] guiding catheter with side holes.  The patient was  on Integrilin and he was given additional heparin to prolong ACT greater  than 200 seconds.  He was given a chewable aspirin and Plavix.   The lesion was a complex lesion in the mid right coronary artery so we  used a PP2 light support wire and were able to cross the lesion without  much difficulty.  We predilated with a 2.25 x 20 mm Maverick performing  one inflation up to 12 atmospheres for 20 seconds.  We then deployed a  2.5 x 24 mm Taxus stent deploying this with one inflation of 15  atmospheres for 20 seconds.  We then postdilated with a 3.0 x 20 mm  Quantum Maverick.  We had a great deal of difficulty seeing the stent  due to calcification in the vessel and we positioned the postdilatation  balloon in the midportion of the 24 mm stent and deployed this with one  inflation of 15 atmospheres for 20 seconds.  Final diagnostic look was  then performed through the guiding catheter.  There  was residual 50%  narrowing proximal to the proximal edge of the stent.   CONCLUSION:  Successful percutaneous coronary intervention of the lesion  in the mid right coronary artery using a Primus drug eluting stent with  improvement in percent diameter narrowing from 95% to 0%.   DISPOSITION:  The patient was taken to the postanesthesia care unit for  further observation.  He may need help with Plavix longterm.      Bruce Elvera Lennox Juanda Chance, MD, [facility]  Electronically Signed     BRB/MEDQ  D:  03/04/2007  T:  03/05/2007  Job:  [id number]   cc:   Pricilla Riffle, MD, [facility]

## 2010-08-09 NOTE — Assessment & Plan Note (Signed)
[facility]                            CARDIOLOGY OFFICE NOTE   NAME:Herring, Joseph BERTINO                    MRN:          [id number]  DATE:04/05/2007                            DOB:          [birth year]    IDENTIFICATION:  [name] is a gentleman who was recently  hospitalized.  He was discharged December 10.   The patient came in with arm pain profound, after eating a couple hot  dogs.  He presented the [facility] emergency room was treated medically  initially, and symptoms improved.  Cardiac catheterization was  performed.  He actually suffered a non-Q-wave MI.  Cardiac  catheterization revealed a 95% RCA lesion that underwent PTCA stent with  a drug-eluting stent.  Proximal LAD had some calcification.  The  circumflex was normal.  There was inferior hypokinesis.   The patient was again discharged, after being enrolled in the Improve It  study.   CURRENT MEDICATIONS:  Include:  1. Study drug,  2. Plavix 75.  3. The patient ran out of Toprol XL 50, took his last dose last night.  4. Lisinopril 5.  5. Aspirin 81.   PHYSICAL EXAM:  GENERAL:  The patient is in no distress.  VITAL SIGNS:  Blood pressure 152/72, pulse 72 and regular, weight 168.  LUNGS:  Clear.  No rales.  CARDIAC:  Regular rate and rhythm, S1-S2, no S3.  ABDOMEN:  Benign.  EXTREMITIES:  Right groin without bruit or hematoma.  No lower extremity  edema.   A 12-lead EKG shows normal sinus rhythm, 68 beats per minute.  Nonspecific ST changes.   IMPRESSION:  1. CAD doing well clinically.  Will need to be on Plavix for at least      a year.  Continue on aspirin 325 for now.  2. Hypertension, not optimally controlled, is given a refill for      Toprol XL and also will increase his lisinopril to 5.  Will check      labs.  3. Dyslipidemia, enrolled in the study trial.   Will need to see about getting him involved in cardiac rehab.   I would like to follow up in 6 weeks, sooner  if problems present.   ADDENDUM:  Was given Spiriva in the hospital, found it did not help  much, has run out.  Will not renew.  Encouraged him to stay without  tobacco.  He has not smoked since admission.  Congratulated on this.     Pricilla Riffle, MD, [facility]  Electronically Signed    PVR/MedQ  DD: 04/06/2007  DT: 04/06/2007  Job #: [id number]

## 2010-08-09 NOTE — Discharge Summary (Signed)
NAMEGASTON, DASE NO.:  [id number]   MEDICAL RECORD NO.:  [id number]          PATIENT TYPE:  INP   LOCATION:  5158                         FACILITY:  [facility]   PHYSICIAN:  Madaline Savage, MD        DATE OF BIRTH:  1945-10-12   DATE OF ADMISSION:  08/28/2007  DATE OF DISCHARGE:  08/30/2007                               DISCHARGE SUMMARY   PRIMARY CARE PHYSICIAN:  None.  This patient is unassigned to [place].   DISCHARGE DIAGNOSES:  1. Chronic obstructive pulmonary disease exacerbation.  2. Acute bronchitis.  3. History of coronary artery disease.  4. Hypertension.  5. Hyperlipidemia.   DISCHARGE MEDICATIONS:  1. Avelox 40 mg once daily for 4 more days.  2. Prednisone 40 mg once daily for 2 days, then 30 mg once daily for 2      days, then 20 mg once daily for 2 days, then 10 mg once daily for 2      days, and then stop.  3. Albuterol metered-dose inhaler 1 puff every 6 hours as needed.  4. Plavix 75 mg daily.  5. Metoprolol 25 mg daily.  6. Lisinopril 10 mg daily.  7. NitroQuick as before as needed.   HISTORY OF PRESENT ILLNESS:  For full history and physical see the  history and physical dated by Dr. Darene Lamer, on August 28, 2007.  In short, Ms.  Conde is a 65 year old gentleman, who has a history of COPD and  coronary artery disease, who comes in with shortness of breath and  cough.  He was wheezing bilaterally and his x-ray of chest did not show  any acute process.  He was admitted with a diagnosis of COPD  exacerbation and for further evaluation and management.   PROCEDURE DONE IN THE HOSPITAL:  He had an x-ray of chest done, on August 28, 2007, which showed no active process.   1. COPD exacerbation.  Mr. Cazier was admitted with a diagnosis of      COPD exacerbation.  He was started on IV Solu-Medrol nebulizers and      oxygen, and his symptoms did improve significantly.  At this time      of discharge, he has being changed to p.o. prednisone and he has  been doing well.  We will taper his steroids in the next 8 days.      He is also given and prescription for rescue inhaler at this time.  2. Acute bronchitis.  Mr. Lynch had complaints of fever.  When he      came, he was also coughing significantly.  He did have an elevated      white cell count on admission.  He was empirically started on      Avelox and his fever subsided, and his white cell count came down.      We will complete a course of 7 days of Avelox.  3. History of hypertension.  He will continue his medications as      before.   DISPOSITION:  He is now being discharged home in the stable condition.  FOLLOWUP:  He does not have a primary care doctor.  We have arranged  follow up with [facility] on September 02, 2007, at 2:30 p.m.  He is being  given the phone number [phone] to call and confirm the appointment.  He  has also been given help with his medications at this time.      Madaline Savage, MD  Electronically Signed     PKN/MEDQ  D:  08/30/2007  T:  08/31/2007  Job:  [id number]

## 2010-08-09 NOTE — H&P (Signed)
NAMEJANZEN, SACKS NO.:  [id number]   MEDICAL RECORD NO.:  [id number]          PATIENT TYPE:  EMS   LOCATION:  MAJO                         FACILITY:  [facility]   PHYSICIAN:  Madaline Savage, MD        DATE OF BIRTH:  17-May-1945   DATE OF ADMISSION:  08/28/2007  DATE OF DISCHARGE:                              HISTORY & PHYSICAL   PRIMARY CARE PHYSICIAN:  None.  This patient is unassigned to [place].   CHIEF COMPLAINT:  Shortness of breath and cough.   HISTORY OF PRESENT ILLNESS:  [name] is a 65 year old gentleman who  has a history of coronary artery disease and COPD who comes in with  shortness of breath, cough, and congestion for the last 1 week.  He  states this has been progressively been getting worse to the point that  he gets really short of breath when he walks a few paces.  He states  that he felt feverish and chilly last night.  He denies any nausea,  vomiting, or abdominal pain.   PAST MEDICAL HISTORY:  1. COPD.  2. Coronary artery disease, status post MI in 2008.  3. Hypertension.  4. Hyperlipidemia.   PAST SURGICAL HISTORY:  Appendectomy.   ALLERGIES:  NO KNOWN DRUG ALLERGIES.   CURRENT MEDICATIONS:  He does not remember the names of all his  medications.  He states that he is on aspirin, Plavix, and Advair  Diskus.   SOCIAL HISTORY:  He quit smoking 3 years ago.  He feels that his medical  condition has deteriorated since he quit smoking.  He denies any history  of alcohol or drug abuse.   FAMILY HISTORY:  Noncontributory.   REVIEW OF SYSTEMS:  GENERAL:  He denies any recent weight loss or weight  gain.  He does complain of fever and chills.  HEENT:  No headaches, no  blurred vision.  No sore throat.  Denies rhinorrhea.  CARDIOVASCULAR:  Denies chest pain, palpitations.  RESPIRATORY:  He does complain of some  shortness breath and productive cough.  GI:  No abdominal pain, nausea,  vomiting, diarrhea, or constipation 10.  GENITOURINARY:   No dysuria or  polyuria.  EXTREMITIES:  No tingling or numbness of toes or fingers.   PHYSICAL EXAMINATION:  GENERAL:  He is a alert and oriented x3.  VITAL SIGNS:  Temperature is 99.3, pulse rate of 162, heart rate of 104  per minute, blood pressure is 160/78, oxygen saturation 98% on 2 liters.  HEENT:  Head atraumatic, normocephalic.  Pupils are bilaterally equal  and reactive to light.  Mucous membranes appear moist.  NECK:  Supple.  No JVD, no carotid bruit.  CARDIOVASCULAR:  S1, S2 heard.  Regular rate and rhythm.  CHEST:  There is decreased air entry bilaterally.  Few scattered wheezes  heard bilaterally.  ABDOMEN:  Soft.  Bowel sounds heard.  EXTREMITIES:  No edema, cyanosis, or clubbing.   LABORATORIES:  White count of 18.5, hemoglobin 39, platelets 259.  Sodium 133, potassium 4, chloride 102, bicarbonate 26,  BUN 9,  creatinine of 1, glucose  is 136.  BNP is 113.  Troponin is less than  0.05, CK-MB is 4.  X-ray of the chest shows slight hyperaeration, no  acute process.   IMPRESSION:  1. Chronic obstructive pulmonary disease exacerbation.  2. Acute bronchitis.  3. Hyperlipidemia.  4. Hypertension.  5. History of coronary artery disease.   PLAN:  This is a 65 year old gentleman who comes in with COPD  exacerbation.  He has improved significantly in the ED after getting a  set of breathing treatments.  At this time, he still continues to have  poor air exchange.  I will start him on IV Solu-Medrol, continue him  with aerosols, and get him oxygen.  I think that he also likely has an  acute bronchitis.  He has a low-grade fever, with a temperature of 99.3,  and he has an elevated white cell count.  I will start him empirically  on IV Avelox.  I will get blood cultures on him.  He does not remember  his medications at this time.  We will restart his aspirin, Plavix, and  Advair Diskus, and will restart the rest of the medications once he  brings those medications in.  I will  put him on DVT prophylaxis.      Madaline Savage, MD  Electronically Signed     PKN/MEDQ  D:  08/28/2007  T:  08/28/2007  Job:  [id number]

## 2010-08-09 NOTE — Cardiovascular Report (Signed)
NAMERITHIK, ODEA NO.:  [id number]   MEDICAL RECORD NO.:  [id number]          PATIENT TYPE:  INP   LOCATION:  2904                         FACILITY:  [facility]   PHYSICIAN:  Joseph Beals. Juanda Chance, MD, FACCDATE OF BIRTH:  Sep 01, 1945   DATE OF PROCEDURE:  03/04/2007  DATE OF DISCHARGE:                            CARDIAC CATHETERIZATION   HISTORY:  [name]  is 65 years old and is now retired from [employer].  He has no prior history of known heart disease, although his  mother had bypass surgery, and he has been a smoker in the past.  He has  not been on the medicines and does not have a primary care physician.  He was admitted to the hospital with prolonged episode of chest pain  Saturday morning and EKG changes showing anterior ST depression.  Enzymes returned positive.  Pain was pretty much gone by the time he got  to [facility] He was admitted with a diagnosis of a non-ST-elevation  myocardial infarction.   PROCEDURE:  The procedure was performed via the right femoral artery  using arterial sheath and 5-French preformed coronary catheters.  A  front wall arterial puncture was performed.  Omnipaque contrast was  used.  The patient tolerated the procedure well.   RESULTS:  The LEFT MAIN CORONARY ARTERY was free of significant disease.   LEFT ANTERIOR DESCENDING ARTERY gave rise to three septal perforators  and two diagonal branches.  The LAD was heavily calcified.  There was a  50% narrowing after the first septal perforator.  There were  irregularities but no other major obstruction.   The CIRCUMFLEX ARTERY gave rise to a marginal branch, an atrial branch,  and two posterolateral branches.  There were calcification  irregularities in the circumflex artery but no significant obstruction.   The RIGHT CORONARY ARTERY was a moderate-size vessel that gave rise to  an early acute marginal branch, a posterior descending branch, and a  posterolateral branch.   There was a 95% stenosis in the midvessel with  50% narrowing just proximal to this.  This vessel is free of major  obstruction.   LEFT VENTRICULOGRAM performed in the RAO projection showed hypokinesis  of the inferior wall.  The apex and anterolateral wall moved well.  Estimated ejection fraction was 55%.   The aortic pressure was 118/61 with mean of 85. Left ventricle pressure  was 118/16.   CONCLUSION:  Coronary artery disease status post recent non-ST-elevation  myocardial infarction with 50% narrowing in the proximal left anterior  descending, no significant obstruction in the circumflex artery, 95%  stenosis in the mid right coronary artery, and inferior wall  hypokinesis.   RECOMMENDATIONS:  Will plan PCI.  We have an emergency STEMI, and we may  need to postpone the PCI until later today.      Joseph Elvera Lennox Juanda Chance, MD, [facility]  Electronically Signed     BRB/MEDQ  D:  03/04/2007  T:  03/04/2007  Job:  [id number]   cc:   Pricilla Riffle, MD, [facility]  Cardiopulmonary Lab

## 2010-08-09 NOTE — H&P (Signed)
NAME:  TRYSTIN, TERHUNE NO.:  [id number]   MEDICAL RECORD NO.:  [id number]           PATIENT TYPE:   LOCATION:                                 FACILITY:   PHYSICIAN:  Pricilla Riffle, MD, FACCDATE OF BIRTH:  1945-07-13   DATE OF ADMISSION:  DATE OF DISCHARGE:                              HISTORY & PHYSICAL   IDENTIFICATION:  [name] is a 65 year old gentleman who presents to  the urgent care today with arm pain.   The patient woke up this morning and had a couple of hot dogs around 11  a.m.  He was not doing anything when he developed severe left arm pain,  8 out of 10 at its worst.  He came to the urgent care.  EKG was done,  which showed significant ST depression in the anterolateral leads, and  he was transferred to [facility] ER.  He noted some shortness of breath with  this.  Again, no chest pain.  He has noted that his mouth has been dry  and hurting over the past couple of days.   He notes no chest pain, question of some minor arm pain prior.  Question  of some increased shortness of breath lately, although he is in no  organized activity.   ALLERGIES:  No known drug allergies.   MEDICATIONS:  None.   PAST MEDICAL HISTORY:  COPD.   SOCIAL HISTORY:  The patient lives in [place] with his son. He is  divorced.  He has an 6 pack/year history of smoking, and quit in  December 2007.  He drinks rarely.  He does not have a regular doctor.   FAMILY HISTORY:  Positive for coronary disease in her mother who had  bypass in her 82s.  Her sister has multiple medical problems.  It is not  clear if they are heart.   REVIEW OF SYSTEMS:  No fever, chills, sweats.  No headache.  No chest  pain.  Positive shortness of breath and dyspnea.  No edema. No  palpitations.  GU:  Negative.  GI:  Negative.  Neuro/psych:  No history  of CVA or weakness in the past.  Cholesterol unknown.  Otherwise, all  systems reviewed and negative to the above problem as noted.   PHYSICAL  EXAMINATION:  VITAL SIGNS:  On arrival to the emergency room,  the patient was in significant distress with left arm pain at 8/10.  With morphine and nitrites, this improved to zero to 2 out of 10.  Blood  pressure on arrival was 160/70, pulse in the 80s, respiratory rate 20.  Now, blood pressure is 117/70.  HEENT:  Normocephalic, atraumatic  EOMI, PERRL.  Mucous membranes are  relatively moist.  No teeth.  NECK:  JVP is normal.  No bruits, no thyromegaly.  LUNGS:  Clear to auscultation without rales or wheezes.  Note, markedly  decreased air flow.  CARDIAC:  Regular rate and rhythm. S1 and S2.  No S3, question S4.  No  murmurs.  ABDOMEN:  Supple, nontender.  Normal bowel sounds.  No hepatomegaly.  EXTREMITIES:  Dorsalis pedis  2+.  No lower extremity edema.  NEUROLOGIC:  Alert and oriented x3.  Cranial nerves II-XII are grossly  intact.  Moving all extremities.   Chest x-ray pending.  A 12-lead EKG shows sinus rhythm at 73 beats per  minute, 2-4 mm ST depression in the anterolateral leads (V2 through V6),  1 mm in I and L.  Labs pending.   IMPRESSION:  The patient is a 65 year old with no history of coronary  artery disease, who has a family history of coronary artery disease and  also a long tobacco history.  He comes in with arm pain since 11.  No  chest pain.  Positive shortness of breath.  EKG with significant ST  depression, consistent with ischemia.   He is given nitroglycerin and 4 mg of morphine, with symptoms improving  markedly. We will go ahead and place him on heparin for now, and  Integrilin.  The patient has received an aspirin.  I have discussed  this.  I would recommend cardiac catheterization to define the anatomy.  Based on his laboratory and on his clinical course will depend on the  timing of the test.  Will get fasting lipids, and follow up on his  baseline laboratory.      Pricilla Riffle, MD, [facility]  Electronically Signed     PVR/MEDQ  D:  03/02/2007  T:   03/03/2007  Job:  [id number]

## 2010-08-11 ENCOUNTER — Other Ambulatory Visit: Payer: Self-pay | Admitting: *Deleted

## 2010-08-11 MED ORDER — METOPROLOL TARTRATE 25 MG PO TABS: 25.0000 mg | ORAL_TABLET | Freq: Two times a day (BID) | ORAL | Status: DC

## 2010-08-12 NOTE — Discharge Summary (Signed)
[place]. [facility]  Patient:    [name], [name] Visit Number: [id number] MRN: [id number]          Service Type: EMS Location: [facility] Attending Physician:  Cathren Laine Dictated by:   [name], M.D. Admit Date:  07/06/2001 Discharge Date: 07/06/2001   CC:         [name], M.D.   Discharge Summary  HISTORY OF PRESENT ILLNESS:  The patient is a 65 year old man with a long standing history of tobacco abuse who had been admitted from 06/18/01 through 06/20/01 for a chronic obstructive pulmonary disease exacerbation.  He had been discharged on a tapering schedule of prednisone, albuterol and Avelox on June 20, 2001.  The patient seemed to get better and returned to work the first week in April.  After working in an area with heavy dust exposure he developed a nonproductive cough which became steadily worse. Ultimately the patient presented to Dr. Valentina Lucks on 06/28/01 with coughing, wheezing and shortness of breath.  PHYSICAL EXAMINATION:  At the time of admission vital signs reveal temperature 101. Heart rate 130. Blood pressure 130/63.  Oxygen saturation 88% on room air.  HEENT:  Described as within normal limits.  CHEST:  Diminished breath sounds diffusely with faint expiratory wheezes.  CARDIOVASCULAR:  Normal S1 and S2 without murmurs, rubs or gallops.  The remainder of the physical examination was described as unremarkable.  RELEVANT LABORATORY DATA:  Included arterial blood gases which showed pH 7.5, pCO2 30, pO2 56, bicarbonate 26.  CBC:  White blood cell count was initially 25,000 with a left shift.  BLOOD SUGAR:  136.  LIVER PROFILE:  Normal.  BLOOD CULTURES:  Negative.  CHEST X-RAY:  No acute disease.  ELECTROCARDIOGRAM:  Evidence of sinus tachycardia.  HOSPITAL COURSE:  On admission the patient was placed on a regimen of Solu-Medrol 60 mg intravenous q.8h, albuterol and Atrovent q.6h and p.r.n. and oxygen.  The next  day the Solu-Medrol was decreased to 40 mg q.8h and Tequin at a dose of 400 mg was daily was added to his regimen.  The patient experienced dramatic improvement.  Oxygen was tapered and discontinued. By 07/02/01 the patient was ready to go home.  He was discharged at that time in markedly improved condition.  DISCHARGE INSTRUCTIONS:  Patient was advised to use  the following medications:  MEDICATIONS: 1. Advair 50/250 one puff b.i.d. 2. Albuterol 3 puffs q.i.d. and p.r.n. 3. Avelox ABC pack one tablet daily times 5 days. 4. Patient was given instructions to taper his prednisone over the next    ten days.  Patient was advised to take 30 mg times 4 days, 30 mg times    four days, 20 mg times four days, 10 mg times four days and then stop.  FOLLOW UP:  Return appointment was scheduled with Dr. Tresa Endo in 7 to 10 days.  DISCHARGE DIAGNOSES: 1. Chronic obstructive pulmonary disease. 2. History of heavy cigarette smoking. Dictated by:   [name], M.D. Attending Physician:  Cathren Laine DD:  07/09/01 TD:  07/09/01 Job: [id number]

## 2010-08-12 NOTE — H&P (Signed)
[place]. [facility]  Patient:    [name], [name] Visit Number: [id number] MRN: [id number]          Service Type: EMS Location: [facility] Attending Physician:  Hanley Seamen Dictated by:   Thora Lance, M.D. Admit Date:  06/28/2001   CC:         [name], M.D.   History and Physical  CHIEF COMPLAINT:  Shortness of breath.  HISTORY OF PRESENT ILLNESS:  This is a 65 year old white male with history of heavy tobacco use, recently admitted March 25 through June 20, 2001 for a COPD exacerbation.  He was discharged on a prednisone taper, Avelox, and albuterol on March 27.  He went back to work at [employer] this week with heavy dust exposure at work.  He has developed a nonproductive cough over the last two days.  He has had increasing shortness of breath over the last 24 hours which became acutely severe tonight at 5 p.m., with wheezing.  He could not walk a couple of steps because of his shortness of breath.  PAST MEDICAL HISTORY:  COPD.  PAST SURGICAL HISTORY:  Appendectomy age 9.  ALLERGIES:  No known drug allergies.  CURRENT MEDICATIONS:  Prednisone taper and an albuterol MDI p.r.n.  FAMILY HISTORY:  Father - none.  Mother - heart problems, pacemaker.  Sister - osteoarthritis.  SOCIAL HISTORY:  Married.  Two children.  Works at [employer].  Tobacco - two packs a day for 40+ years, quit last week.  Social alcohol.  REVIEW OF SYSTEMS:  No chest pain, palpitations, edema, fevers, or chills.  PHYSICAL EXAMINATION:  GENERAL:  Initially in respiratory distress when he came to the ER; now more comfortable.  VITAL SIGNS:  Temperature 101.0, heart rate 142, blood pressure 130/63, saturation 88% on room air.  HEENT:  Pupils equal and round, respond to light.  Extraocular movements intact.  Tympanic membranes are clear.  Oropharynx clear.  NECK:  Supple.  No lymphadenopathy, no carotid bruits.  LUNGS:  Show decreased breath sounds  bilaterally with faint expiratory wheezes.  HEART:  Regular rate and rhythm without murmur, gallop, or rub.  ABDOMEN:  Soft, nontender.  Normal bowel sounds.  No mass or hepatosplenomegaly.  GENITOURINARY AND RECTAL:  Deferred.  EXTREMITIES:  Show no edema.  Peripheral pulses normal.  NEUROLOGIC:  Nonfocal.  LABORATORY DATA:  CBC shows WBC 25.4 with a left shift, hemoglobin 16.3, platelet count 254.  ABG shows pH 7.54, PCO2 30, PO2 56.  BMET shows sodium 136, potassium 3.9, chloride 104, bicarbonate 25, BUN 15, creatinine 0.7, glucose 136.  Chest x-ray shows no acute disease.  EKG shows sinus tachycardia with nonspecific ST-T wave changes.  ASSESSMENT:  Chronic obstructive pulmonary disease exacerbation with low-grade fever and hypoxemia.  Possibly precipitated by recent dust exposure at [facility].  There is no definite evidence of bacterial infection (no increase in sputum volume or change in color, no infiltrate on chest x-ray).  The increased white count may be secondary to stress and his recent dose of steroids.  PLAN:  Admit.  IV steroids.  Albuterol and Atrovent nebulizers.  Hold antibiotics.  Oxygen.  IV fluids.  The patient has discontinued tobacco use. Dictated by:   Thora Lance, M.D. Attending Physician:  Hanley Seamen DD:  06/28/01 TD:  06/29/01 Job: [id number]

## 2010-08-12 NOTE — Discharge Summary (Signed)
[place]. [facility]  Patient:    [name], [name] Visit Number: [id number] MRN: [id number]          Service Type: MED Location: [phone] Attending Physician:  [name] Dictated by:   [name], M.D. Admit Date:  06/18/2001 Discharge Date: 06/20/2001                             Discharge Summary  HISTORY OF PRESENT ILLNESS:  The patient is a 65 year old man who initially presented on March 25 with acute onset of increased shortness of breath and nonproductive cough which had worsened over the past three days.  This was associated with low-grade temperature.  Of note is that the patient has a longstanding history of tobacco usage.  He had no previous history of any similar problems.  On presentation, the patient was evaluated by Dr. Nehemiah Settle who noted on chest exam that the patient had poor air movement with diffuse loud wheezes.  Chest x-ray obtained showed hyperaerated lung but no acute infiltrates.  Electrocardiogram revealed normal sinus rhythm with nonspecific ST and T wave changes.  HOSPITAL COURSE:  The patient was admitted with presumptive diagnoses of asthmatic bronchitis and COPD.  He was started on Tequin 400 mg IV daily as well as Decadron 6 mg IV every 8 hours.  Nebulizer treatments with albuterol and Atrovent were initiated.  Protonix 40 mg was given on a daily basis. Relevant laboratory studies obtained included a normal BMET, negative cardiac enzymes, normal CBC.  The initial arterial blood gases showed a PO2 68 on room air, PCO2 41, pH 7.36.  The patients course was one of steady improvement. By June 20, 2001, he was felt to be a candidate for discharge.  DISCHARGE DIAGNOSES:  Acute asthmatic bronchitis in a gentleman with underlying chronic obstructive pulmonary disease secondary to cigarette smoking.  DISCHARGE MEDICATIONS: 1. Prednisone 40 x 3, 30 x 3, 20 x 3, 10 x 3, and then stop. 2. Albuterol metered dose  inhaler 3 puffs q.i.d. 3. Avelox 1 tablet daily x 5 days.  SPECIAL INSTRUCTIONS:  The patient was advised in very clear terms that it was absolutely essential to his health that he discontinue cigarette smoking.  FOLLOWUP:  We will plan to see him back in the office on Monday or Tuesday. Dictated by:   [name], M.D. Attending Physician:  [name] DD:  06/20/01 TD:  06/20/01 Job: [id number]

## 2010-08-12 NOTE — Assessment & Plan Note (Signed)
[facility]                                 ON-CALL NOTE   NAME:WHITELEYDarald, Uzzle                    MRN:          [id number]  DATE:04/28/2007                            DOB:          Nov 06, 1945    PRIMARY CARDIOLOGIST:  Dr. Dietrich Pates.   I received a call from [name]'s ex-wife today stating that he went  to have his Toprol refilled; however, it history been recalled and so  they could not fill it.  I called the [facility] at [phone] and they  have informed me it has not been recalled, but it is on backorder and so  it will be a little while before they will have extended-release  metoprolol.  I then gave them a prescription for metoprolol tartrate 25  mg one p.o. b.i.d., #60 with 6 refills.      Nicolasa Ducking, ANP  Electronically Signed      Arturo Morton. Riley Kill, MD, [facility]  Electronically Signed   CB/MedQ  DD: 04/28/2007  DT: 04/29/2007  Job #: [id number]

## 2010-11-11 ENCOUNTER — Other Ambulatory Visit: Payer: Self-pay | Admitting: *Deleted

## 2010-11-11 MED ORDER — LISINOPRIL 20 MG PO TABS: 20.0000 mg | ORAL_TABLET | Freq: Every day | ORAL | Status: DC

## 2010-11-11 NOTE — Telephone Encounter (Signed)
RX SENT IN TODAY. [name]

## 2010-11-16 ENCOUNTER — Telehealth: Payer: Self-pay | Admitting: Internal Medicine

## 2010-11-16 NOTE — Telephone Encounter (Signed)
Called patient back. He states that he has some lightheadness when going from a lying to a sitting position. He states that his wife checks his BP and that it has been normal. No complaints of any staggering when walking down the hallway. Advised him to rise slowly when getting out of bed and to sit at the edge for a few minutes before getting up to walk. He will call back if he has any more problems. Has an appointment with Dr.Ross next month.

## 2010-11-16 NOTE — Telephone Encounter (Signed)
Patient C/O lightheadedness

## 2010-12-16 ENCOUNTER — Ambulatory Visit (INDEPENDENT_AMBULATORY_CARE_PROVIDER_SITE_OTHER): Payer: Medicare Other | Admitting: Internal Medicine

## 2010-12-16 ENCOUNTER — Encounter: Payer: Self-pay | Admitting: Internal Medicine

## 2010-12-16 VITALS — BP 120/71 | HR 63 | Ht 66.0 in | Wt 172.0 lb

## 2010-12-16 DIAGNOSIS — I2510 Atherosclerotic heart disease of native coronary artery without angina pectoris: Secondary | ICD-10-CM

## 2010-12-16 DIAGNOSIS — I10 Essential (primary) hypertension: Secondary | ICD-10-CM

## 2010-12-16 DIAGNOSIS — E785 Hyperlipidemia, unspecified: Secondary | ICD-10-CM

## 2010-12-16 LAB — BASIC METABOLIC PANEL
CO2: 29 mEq/L (ref 19–32)
Chloride: 106 mEq/L (ref 96–112)
Sodium: 140 mEq/L (ref 135–145)

## 2010-12-16 MED ORDER — CLOPIDOGREL BISULFATE 75 MG PO TABS: 75.0000 mg | ORAL_TABLET | Freq: Every day | ORAL | Status: DC

## 2010-12-16 NOTE — Progress Notes (Signed)
HPIMr. Fill is a 65 year old with a history of CAD (s/p NSTEMI in 2008 with PTCA/DES to RCA. He was last in clinc last winter.  He was admitted to  [facility] near that time with chest tightness and a nose bleed.  Plavix was held transiently.  He r/o for MI Since seen has not had any more nose bleeds. Breathing is OK.  Using Advair.   No chest pains.   Walks.  Rides exercise bike every day. No Known Allergies  Current Outpatient Prescriptions  Medication Sig Dispense Refill  . amLODipine (NORVASC) 5 MG tablet Take 5 mg by mouth daily.        Marland Kitchen aspirin 81 MG tablet Take 81 mg by mouth daily.        . clopidogrel (PLAVIX) 75 MG tablet Take 75 mg by mouth daily.        . Fluticasone-Salmeterol (ADVAIR DISKUS) 500-50 MCG/DOSE AEPB Inhale 1 puff into the lungs every 12 (twelve) hours.       Marland Kitchen lisinopril (PRINIVIL,ZESTRIL) 20 MG tablet Take 1 tablet (20 mg total) by mouth daily.  90 tablet  3  . metoprolol tartrate (LOPRESSOR) 25 MG tablet Take 1 tablet (25 mg total) by mouth 2 (two) times daily.  60 tablet  6  . NON FORMULARY Study meds.         Past Medical History  Diagnosis Date  . Arthropathy, unspecified, site unspecified   . Old myocardial infarction   . Other premature beats   . Chronic airway obstruction, not elsewhere classified   . Other and unspecified hyperlipidemia   . Essential hypertension, benign   . Coronary atherosclerosis of native coronary artery     Past Surgical History  Procedure Date  . Appendectomy   . Ptca     Family History  Problem Relation Age of Onset  . Heart disease Mother 53    CABG    History   Social History  . Marital Status: Divorced    Spouse Name: N/A    Number of Children: N/A  . Years of Education: N/A   Occupational History  . 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 Smoking status: Former Smoker -- 4.0 packs/day for 6 years    Types: Cigarettes    Quit date: 03/27/2006  .  Smokeless tobacco: Not on file  . Alcohol Use: No  . Drug Use: Not on file  . Sexually Active: Not on file   Other Topics Concern  . Not on file   Social History Narrative  . No narrative on file    Review of Systems:  All systems reviewed.  They are negative to the above problem except as previously stated.  Vital Signs: BP 120/71  Pulse 63  Ht 5\' 6"  (1.676 m)  Wt 172 lb (78.019 kg)  BMI 27.76 kg/m2  Physical Exam\  Patinet is in NAD  HEENT:  Normocephalic, atraumatic. EOMI, PERRLA.  Neck: JVP is normal. No thyromegaly. No bruits.  Lungs: clear to auscultation. No rales no wheezes.  Heart: Regular rate and rhythm. Normal S1, S2. No S3.   No significant murmurs. PMI not displaced.  Abdomen:  Supple, nontender. Normal bowel sounds. No masses. No hepatomegaly.  Extremities:   Good distal pulses throughout. No lower extremity edema.  Musculoskeletal :moving all extremities.  Neuro:   alert and oriented x3.  CN II-XII grossly intact.  EKG:  Sinus  rhythm with PACs.   Assessment and Plan:

## 2010-12-16 NOTE — Patient Instructions (Signed)
Your physician recommends that you return for lab work in: today. Your physician wants you to follow-up in: 1 year with Dr Tenny Craw.  You will receive a reminder letter in the mail two months in advance. If you don't receive a letter, please call our office to schedule the follow-up appointment.

## 2010-12-16 NOTE — Assessment & Plan Note (Signed)
Doing well on current regimen.  No changes.  Stay active.

## 2010-12-16 NOTE — Assessment & Plan Note (Signed)
BP is good.  Keep on same regimen.  Check BMET.

## 2010-12-16 NOTE — Assessment & Plan Note (Signed)
Lipids are good.  Keep on same regimen.

## 2010-12-22 LAB — CBC
Hemoglobin: 13.3
Hemoglobin: 13.9
MCHC: 33.6
MCHC: 33.6
MCV: 89.6
RBC: 4.38
RBC: 4.63

## 2010-12-22 LAB — CULTURE, BLOOD (ROUTINE X 2): Culture: NO GROWTH

## 2010-12-22 LAB — POCT I-STAT, CHEM 8
Creatinine, Ser: 1
Glucose, Bld: 136 — ABNORMAL HIGH
Hemoglobin: 15
TCO2: 26

## 2010-12-22 LAB — DIFFERENTIAL
Basophils Absolute: 0
Basophils Relative: 0
Lymphocytes Relative: 8 — ABNORMAL LOW
Monocytes Absolute: 0.5
Monocytes Absolute: 1.8 — ABNORMAL HIGH
Monocytes Relative: 10
Neutro Abs: 11.9 — ABNORMAL HIGH
Neutro Abs: 14.9 — ABNORMAL HIGH
Neutrophils Relative %: 88 — ABNORMAL HIGH

## 2010-12-22 LAB — CK TOTAL AND CKMB (NOT AT ARMC)
CK, MB: 10.9 — ABNORMAL HIGH
CK, MB: 5.3 — ABNORMAL HIGH
CK, MB: 7.9 — ABNORMAL HIGH
Total CK: 219

## 2010-12-22 LAB — BASIC METABOLIC PANEL
CO2: 25
Calcium: 9.1
Chloride: 101
GFR calc Af Amer: >60
Sodium: 136

## 2010-12-22 LAB — TROPONIN I: Troponin I: 0.03

## 2010-12-22 LAB — LIPID PANEL
HDL: 75
Total CHOL/HDL Ratio: 1.7

## 2011-01-02 LAB — CBC
HCT: 32.8 — ABNORMAL LOW
HCT: 35.7 — ABNORMAL LOW
HCT: 38.1
Hemoglobin: 11.2 — ABNORMAL LOW
Hemoglobin: 12.1
Hemoglobin: 12.7
Hemoglobin: 13.7
MCHC: 33.4
MCHC: 33.8
MCHC: 33.8
MCHC: 34.1
MCV: 88.8
MCV: 89.7
Platelets: 203
Platelets: 232
RBC: 3.99
RBC: 4.29
RBC: 4.51
RDW: 14.7
RDW: 14.8
RDW: 15
RDW: 15.1
WBC: 13 — ABNORMAL HIGH
WBC: 9.4

## 2011-01-02 LAB — I-STAT 8, (EC8 V) (CONVERTED LAB)
BUN: 12
Bicarbonate: 26.2 — ABNORMAL HIGH
Chloride: 106
Glucose, Bld: 164 — ABNORMAL HIGH
Hemoglobin: 15
pCO2, Ven: 42 — ABNORMAL LOW

## 2011-01-02 LAB — CARDIAC PANEL(CRET KIN+CKTOT+MB+TROPI)
CK, MB: 286.5 — ABNORMAL HIGH
Relative Index: 17.1 — ABNORMAL HIGH
Relative Index: 17.7 — ABNORMAL HIGH
Troponin I: 27.29

## 2011-01-02 LAB — APTT: aPTT: 23 — ABNORMAL LOW

## 2011-01-02 LAB — CLOSTRIDIUM DIFFICILE EIA: C difficile Toxins A+B, EIA: NEGATIVE

## 2011-01-02 LAB — BASIC METABOLIC PANEL
BUN: 11
BUN: 7
CO2: 26
CO2: 29
Chloride: 108
GFR calc non Af Amer: >60
Glucose, Bld: 110 — ABNORMAL HIGH
Glucose, Bld: 98
Potassium: 4
Potassium: 4.2
Sodium: 138

## 2011-01-02 LAB — DIFFERENTIAL
Lymphocytes Relative: 12
Lymphs Abs: 1.6
Monocytes Absolute: 0.5
Monocytes Relative: 4
Neutro Abs: 10.8 — ABNORMAL HIGH

## 2011-01-02 LAB — LIPID PANEL
Cholesterol: 193
LDL Cholesterol: 124 — ABNORMAL HIGH
Triglycerides: 87
VLDL: 17

## 2011-01-02 LAB — POCT CARDIAC MARKERS
CKMB, poc: 19.8
Operator id: 265201
Troponin i, poc: 0.57

## 2011-01-02 LAB — COMPREHENSIVE METABOLIC PANEL
ALT: 33
CO2: 27
Calcium: 8.4
Creatinine, Ser: 0.91
GFR calc Af Amer: >60
GFR calc non Af Amer: >60
Glucose, Bld: 147 — ABNORMAL HIGH
Sodium: 137
Total Protein: 6.6

## 2011-01-02 LAB — CK TOTAL AND CKMB (NOT AT ARMC)
CK, MB: 19.3 — ABNORMAL HIGH
Relative Index: 8.8 — ABNORMAL HIGH
Total CK: 220 — ABNORMAL HIGH

## 2011-01-02 LAB — HEPARIN LEVEL (UNFRACTIONATED)
Heparin Unfractionated: 0.51
Heparin Unfractionated: 0.6

## 2011-01-02 LAB — PROTIME-INR: INR: 0.9

## 2011-02-20 ENCOUNTER — Encounter (INDEPENDENT_AMBULATORY_CARE_PROVIDER_SITE_OTHER): Payer: Medicare Other

## 2011-02-20 DIAGNOSIS — R0989 Other specified symptoms and signs involving the circulatory and respiratory systems: Secondary | ICD-10-CM

## 2011-02-23 ENCOUNTER — Encounter: Payer: Self-pay | Admitting: Cardiology

## 2011-03-14 ENCOUNTER — Other Ambulatory Visit: Payer: Self-pay | Admitting: Internal Medicine

## 2011-03-14 MED ORDER — METOPROLOL TARTRATE 25 MG PO TABS: 25.0000 mg | ORAL_TABLET | Freq: Two times a day (BID) | ORAL | Status: DC

## 2011-04-14 ENCOUNTER — Other Ambulatory Visit: Payer: Self-pay | Admitting: *Deleted

## 2011-04-14 MED ORDER — AMLODIPINE BESYLATE 5 MG PO TABS: 5.0000 mg | ORAL_TABLET | Freq: Every day | ORAL | Status: DC

## 2011-05-02 ENCOUNTER — Telehealth: Payer: Self-pay | Admitting: Internal Medicine

## 2011-05-02 NOTE — Telephone Encounter (Signed)
Patient states has been having something like arthritis  pain on hand cramping also swollen. Pt is having problems with stiffness on his shoulder. He was called to be referred to a Doctor for that. Patient is aware to call his PCP for an appointment so he can  Evaluate his symptoms, then he can referred him to the appropriate doctor. Patient verbalized understanding.

## 2011-05-02 NOTE — Telephone Encounter (Signed)
Pt having trouble with his shoulders hurting with movement and his hands cramping and swelling and he needs a referral for that

## 2011-06-26 ENCOUNTER — Telehealth: Payer: Self-pay | Admitting: Pulmonary Disease

## 2011-06-26 ENCOUNTER — Other Ambulatory Visit: Payer: Self-pay | Admitting: Pulmonary Disease

## 2011-06-26 NOTE — Telephone Encounter (Signed)
Pt aware that we have sent refill to [facility]-I also called the pharmacy to let them know patient is on his way there to pick up RX.

## 2011-06-26 NOTE — Telephone Encounter (Signed)
Duplicate message: [name], CMA 06/26/2011 1:53 PM Signed  Pt aware that we have sent refill to [facility]-I also called the pharmacy to let them know patient is on his way there to pick up RX.

## 2011-07-12 ENCOUNTER — Ambulatory Visit (INDEPENDENT_AMBULATORY_CARE_PROVIDER_SITE_OTHER): Payer: Medicare Other | Admitting: Pulmonary Disease

## 2011-07-12 ENCOUNTER — Encounter: Payer: Self-pay | Admitting: Pulmonary Disease

## 2011-07-12 ENCOUNTER — Ambulatory Visit (INDEPENDENT_AMBULATORY_CARE_PROVIDER_SITE_OTHER)
Admission: RE | Admit: 2011-07-12 | Discharge: 2011-07-12 | Disposition: A | Discharge status: Home / Self Care | Payer: Medicare Other | Source: Ambulatory Visit | Attending: Pulmonary Disease | Admitting: Pulmonary Disease

## 2011-07-12 VITALS — BP 122/60 | HR 76 | Temp 97.4°F | Ht 67.5 in | Wt 176.2 lb

## 2011-07-12 DIAGNOSIS — J449 Chronic obstructive pulmonary disease, unspecified: Secondary | ICD-10-CM

## 2011-07-12 MED ORDER — FLUTICASONE-SALMETEROL 250-50 MCG/DOSE IN AEPB: INHALATION_SPRAY | RESPIRATORY_TRACT | Status: DC

## 2011-07-12 NOTE — Patient Instructions (Addendum)
Advair refills x 5  Exercise program recommended at [facility] hospital Chest Xray today - pl find out if this is covered or not

## 2011-07-12 NOTE — Progress Notes (Signed)
  Subjective:    Patient ID: [name], male    DOB: 1945/12/07, 66 y.o.   MRN: [id number]  HPI 65/M, ex smoker for FU of severe COPD - gold stg iV  46 PY smoker , quit 2008, when he had an MI  Can work in the yard x 20 mins, walk around the house or in the store  Put on advair by [employer], taking once daily -does not have 'rescue ' inhaler  PFT showed FEv1 23% with good response to bronchodilator improving to 30%, air trapping & decreased diffusion c/w emphysema.  He has been on lisinopril   07/05/2010 Acute OV - restart advair   07/12/2011 spiriva helped a little bit in the past - does not want to try it due to fixed income No increased cough/congestion, discolored mucus, fever or leg swelling.  Pt states his breathing has been doing fair. denies any cough, occasional wheezing. Pt has been doing fine when using his inhalers daily CXR - coarse interstitium as prior     Review of Systems Pt denies any significant  nasal congestion or excess secretions, fever, chills, sweats, unintended wt loss, pleuritic or exertional cp, orthopnea pnd or leg swelling.  Pt also denies any obvious fluctuation in symptoms with weather or environmental change or other alleviating or aggravating factors.    Pt denies any increase in rescue therapy over baseline, denies waking up needing it or having early am exacerbations or coughing/wheezing/ or dyspnea      Objective:   Physical Exam  Gen. Pleasant, central obesity, in no distress ENT - no lesions, no post nasal drip Neck: No JVD, no thyromegaly, no carotid bruits Lungs: no use of accessory muscles, no dullness to percussion, clear without rales or rhonchi  Cardiovascular: Rhythm regular, heart sounds  normal, no murmurs or gallops, no peripheral edema Musculoskeletal: No deformities, no cyanosis or clubbing        Assessment & Plan:

## 2011-07-15 NOTE — Assessment & Plan Note (Signed)
Advair refills x 5  Exercise program recommended at [facility] hospital He is worried about cost issues

## 2011-07-21 ENCOUNTER — Encounter (INDEPENDENT_AMBULATORY_CARE_PROVIDER_SITE_OTHER): Payer: Self-pay | Admitting: Surgery

## 2011-08-10 ENCOUNTER — Ambulatory Visit: Payer: Medicare Other | Admitting: Pulmonary Disease

## 2011-08-14 ENCOUNTER — Ambulatory Visit (INDEPENDENT_AMBULATORY_CARE_PROVIDER_SITE_OTHER): Payer: Medicare Other | Admitting: Surgery

## 2011-09-21 ENCOUNTER — Encounter (INDEPENDENT_AMBULATORY_CARE_PROVIDER_SITE_OTHER): Payer: Self-pay | Admitting: Surgery

## 2011-09-21 ENCOUNTER — Encounter (INDEPENDENT_AMBULATORY_CARE_PROVIDER_SITE_OTHER): Payer: Self-pay | Admitting: General Surgery

## 2011-09-21 ENCOUNTER — Ambulatory Visit (INDEPENDENT_AMBULATORY_CARE_PROVIDER_SITE_OTHER): Payer: Medicare Other | Admitting: Surgery

## 2011-09-21 VITALS — BP 124/58 | HR 68 | Temp 97.9°F | Resp 16 | Ht 67.5 in | Wt 176.2 lb

## 2011-09-21 DIAGNOSIS — K439 Ventral hernia without obstruction or gangrene: Secondary | ICD-10-CM

## 2011-09-21 DIAGNOSIS — K429 Umbilical hernia without obstruction or gangrene: Secondary | ICD-10-CM

## 2011-09-21 HISTORY — DX: Ventral hernia without obstruction or gangrene: K43.9

## 2011-09-21 NOTE — Progress Notes (Signed)
Patient ID: [name], male   DOB: 08-29-1945, 66 y.o.   MRN: [id number]  Chief Complaint  Patient presents with  . Umbilical Hernia    new pt    HPI [name] is a 66 y.o. male. this is a very pleasant gentleman referred by Dr. Johny Herring for evaluation of a symptomatic umbilical hernia. The patient reports he will have some abdominal firmness from time to time with mild pain. He has no nausea or vomiting or obstructive symptoms. He is otherwise without complaints. The pain is only mild to moderate and again described as a hard or firmness. It is not refer any where else other than at the umbilicus HPI  Past Medical History  Diagnosis Date  . Arthropathy, unspecified, site unspecified   . Old myocardial infarction   . Other premature beats   . Chronic airway obstruction, not elsewhere classified   . Other and unspecified hyperlipidemia   . Essential hypertension, benign   . Coronary atherosclerosis of native coronary artery     Past Surgical History  Procedure Date  . Appendectomy   . Ptca     Family History  Problem Relation Age of Onset  . Heart disease Mother 6    CABG    Social History History  Substance Use Topics  . Smoking status: Former Smoker -- 4.0 packs/day for 6 years    Types: Cigarettes    Quit date: 03/27/2006  . Smokeless tobacco: Not on file  . Alcohol Use: No    No Known Allergies  Current Outpatient Prescriptions  Medication Sig Dispense Refill  . amLODipine (NORVASC) 5 MG tablet Take 1 tablet (5 mg total) by mouth daily.  30 tablet  5  . aspirin 81 MG tablet Take 81 mg by mouth daily.        . clopidogrel (PLAVIX) 75 MG tablet Take 1 tablet (75 mg total) by mouth daily.  30 tablet  11  . Fluticasone-Salmeterol (ADVAIR DISKUS) 250-50 MCG/DOSE AEPB 1 puff twice a day. Rinse mouth out well after each use  60 each  5  . lisinopril (PRINIVIL,ZESTRIL) 20 MG tablet Take 1 tablet (20 mg total) by mouth daily.  90 tablet  3  .  metoprolol tartrate (LOPRESSOR) 25 MG tablet Take 1 tablet (25 mg total) by mouth 2 (two) times daily.  60 tablet  6  . naproxen sodium (ANAPROX) 550 MG tablet Take 550 mg by mouth 2 (two) times daily as needed.      . NON FORMULARY Study meds.       . Probiotic Product (ALIGN PO) Take by mouth.        Review of Systems Review of Systems  Constitutional: Negative for fever, chills and unexpected weight change.  HENT: Negative for hearing loss, congestion, sore throat, trouble swallowing and voice change.   Eyes: Negative for visual disturbance.  Respiratory: Negative for cough and wheezing.   Cardiovascular: Negative for chest pain, palpitations and leg swelling.  Gastrointestinal: Positive for abdominal pain and abdominal distention. Negative for nausea, vomiting, diarrhea, constipation, blood in stool, anal bleeding and rectal pain.  Genitourinary: Negative for hematuria and difficulty urinating.  Musculoskeletal: Negative for arthralgias.  Skin: Negative for rash and wound.  Neurological: Negative for seizures, syncope, weakness and headaches.  Hematological: Negative for adenopathy. Does not bruise/bleed easily.  Psychiatric/Behavioral: Negative for confusion.    Blood pressure 124/58, pulse 68, temperature 97.9 F (36.6 C), temperature source Temporal, resp. rate 16, height 5' 7.5" (  1.715 m), weight 176 lb 3.2 oz (79.924 kg).  Physical Exam Physical Exam  Constitutional: He is oriented to person, place, and time. He appears well-developed and well-nourished. No distress.  HENT:  Head: Normocephalic and atraumatic.  Right Ear: External ear normal.  Left Ear: External ear normal.  Nose: Nose normal.  Mouth/Throat: Oropharynx is clear and moist. No oropharyngeal exudate.  Eyes: Conjunctivae are normal. Pupils are equal, round, and reactive to light. No scleral icterus.  Neck: Normal range of motion. Neck supple. No tracheal deviation present. No thyromegaly present.    Cardiovascular: Normal rate, regular rhythm, normal heart sounds and intact distal pulses.   No murmur heard. Pulmonary/Chest: Effort normal and breath sounds normal. No respiratory distress. He has no wheezes.  Abdominal: Soft. Bowel sounds are normal. He exhibits no distension. There is no tenderness.       There is a small, reducible umbilical hernia  Musculoskeletal: Normal range of motion. He exhibits no edema and no tenderness.  Lymphadenopathy:    He has no cervical adenopathy.  Neurological: He is alert and oriented to person, place, and time.  Skin: Skin is warm and dry. No rash noted. No erythema.  Psychiatric: His behavior is normal. Judgment normal.    Data Reviewed I have reviewed the notes from his primary care physician  Assessment    Symptomatic umbilical hernia    Plan    Repair with mesh was recommended. I discussed this with him in detail. I discussed the risk of surgery which includes, but is not limited to bleeding, infection, recurrence, injury to surrounding structures, need for further surgery, et Karie Soda. He understands and wishes to proceed. We will need to obtain cardiac clearance from his cardiologist preoperatively. This will be arranged       [name] A 09/21/2011, 1:57 PM

## 2011-09-21 NOTE — Progress Notes (Deleted)
Subjective:     Patient ID: [name], male   DOB: 1946/01/22, 66 y.o.   MRN: [id number]  HPI   Review of Systems     Objective:   Physical Exam     Assessment:     ***    Plan:     ***

## 2011-09-25 ENCOUNTER — Ambulatory Visit: Payer: Medicare Other | Admitting: Pulmonary Disease

## 2011-09-26 ENCOUNTER — Telehealth: Payer: Self-pay | Admitting: Pulmonary Disease

## 2011-09-26 NOTE — Telephone Encounter (Signed)
Pt returned triage's call.  Holly D Pryor ° °

## 2011-09-26 NOTE — Telephone Encounter (Signed)
lmomtcb x1 for pt 

## 2011-09-26 NOTE — Telephone Encounter (Signed)
I spoke with pt and is coming in 7/24 at 10:30. Nothing further was needed

## 2011-09-27 ENCOUNTER — Encounter (INDEPENDENT_AMBULATORY_CARE_PROVIDER_SITE_OTHER): Payer: Self-pay

## 2011-10-04 ENCOUNTER — Other Ambulatory Visit: Payer: Self-pay | Admitting: Internal Medicine

## 2011-10-18 ENCOUNTER — Ambulatory Visit (INDEPENDENT_AMBULATORY_CARE_PROVIDER_SITE_OTHER): Payer: Medicare Other | Admitting: Pulmonary Disease

## 2011-10-18 ENCOUNTER — Other Ambulatory Visit: Payer: Self-pay | Admitting: *Deleted

## 2011-10-18 ENCOUNTER — Encounter: Payer: Self-pay | Admitting: Pulmonary Disease

## 2011-10-18 VITALS — BP 114/56 | HR 78 | Temp 97.6°F | Ht 67.0 in | Wt 172.8 lb

## 2011-10-18 DIAGNOSIS — J449 Chronic obstructive pulmonary disease, unspecified: Secondary | ICD-10-CM

## 2011-10-18 MED ORDER — AZITHROMYCIN 1 G PO PACK: 1.0000 | PACK | Freq: Once | ORAL | Status: AC

## 2011-10-18 MED ORDER — PREDNISONE 10 MG PO TABS: ORAL_TABLET | ORAL | Status: DC

## 2011-10-18 NOTE — Assessment & Plan Note (Addendum)
Treat as flare - aggressively since can make hernia worse Z-pak Prednisone 20 mg x 1 week, then 10 mg x 1 week then stop DELSYM 2 tsp thrice daily for cough - instead of mucinex Start rehab program If cough persists, then may have to stop lisinopril He would be moderate risk from pulm standpt for hernia repair under general anesthesia - post op pulm risk would be lower with spinal .

## 2011-10-18 NOTE — Patient Instructions (Signed)
Z-pak Prednisone 20 mg x 1 week, then 10 mg x 1 week then stop DELSYM 2 tsp thrice daily for cough - instead of mucinex Start rehab program If cough persists, then may have to stop lisinopril

## 2011-10-18 NOTE — Progress Notes (Signed)
  Subjective:    Patient ID: [name], male    DOB: Aug 01, 1945, 66 y.o.   MRN: [id number]  HPI  [name], ex smoker for FU of severe COPD - gold stg iV  46 PY smoker , quit 2008, when he had an MI  Can work in the yard x 20 mins, walk around the house or in the store  PFT showed FEv1 23% with good response to bronchodilator improving to 30%, air trapping & decreased diffusion c/w  Put on advair by [employer], taking once daily  emphysema.  Med review shows lisinopril  07/05/2010 Acute OV - restart advair  07/12/2011 spiriva helped a little bit in the past - does not want to try it due to fixed income    10/18/2011 Hernia repair planned - [facility] Coughing - cngestion , since Friday, uri c/o sob, dry cough (taking mucinex not helping), chest congestion, light headed, phlem feels stuck in his throat. denies any chest tx, wheezing Contacted by pulm rehab - wants to wait until after surgery CXR 4/13- hyperinflation, coarse intersttial markings as prior No excess use of 'rescue ' inhaler     Review of Systems neg for any significant sore throat, dysphagia, itching, sneezing, nasal congestion or excess/ purulent secretions, fever, chills, sweats, unintended wt loss, pleuritic or exertional cp, hempoptysis, orthopnea pnd or change in chronic leg swelling. Also denies presyncope, palpitations, heartburn, abdominal pain, nausea, vomiting, diarrhea or change in bowel or urinary habits, dysuria,hematuria, rash, arthralgias, visual complaints, headache, numbness weakness or ataxia.     Objective:   Physical Exam  Gen. Pleasant, well-nourished, in no distress, tattoos + colorful ENT - no lesions, no post nasal drip Neck: No JVD, no thyromegaly, no carotid bruits Lungs: no use of accessory muscles, no dullness to percussion, decreased without rales or rhonchi  Cardiovascular: Rhythm regular, heart sounds  normal, no murmurs or gallops, no peripheral edema Musculoskeletal: No deformities, no  cyanosis or clubbing        Assessment & Plan:

## 2011-10-30 ENCOUNTER — Institutional Professional Consult (permissible substitution): Payer: Medicare Other | Admitting: Internal Medicine

## 2011-11-01 ENCOUNTER — Other Ambulatory Visit: Payer: Self-pay | Admitting: Internal Medicine

## 2011-11-13 ENCOUNTER — Ambulatory Visit (INDEPENDENT_AMBULATORY_CARE_PROVIDER_SITE_OTHER): Payer: Medicare Other | Admitting: Internal Medicine

## 2011-11-13 ENCOUNTER — Encounter: Payer: Self-pay | Admitting: Internal Medicine

## 2011-11-13 ENCOUNTER — Institutional Professional Consult (permissible substitution): Payer: Medicare Other | Admitting: Internal Medicine

## 2011-11-13 VITALS — BP 100/60 | HR 80 | Ht 67.0 in | Wt 170.0 lb

## 2011-11-13 DIAGNOSIS — I2510 Atherosclerotic heart disease of native coronary artery without angina pectoris: Secondary | ICD-10-CM

## 2011-11-13 DIAGNOSIS — I252 Old myocardial infarction: Secondary | ICD-10-CM

## 2011-11-13 NOTE — Patient Instructions (Signed)
Lab work today We will call you with results.  STOP PLAVIX

## 2011-11-13 NOTE — Progress Notes (Addendum)
HPI Patient is a 66 year old iwht a history of CAD (s/p NSTEMI in 2008 with PTCA/DES to RCA).  I saw him in clinic in Sept 2012. Since seen he has done well.  No CP  Breathing is overall OK  Notes occasional wheeze. Remains active.  No Known Allergies  Current Outpatient Prescriptions  Medication Sig Dispense Refill  . amLODipine (NORVASC) 5 MG tablet TAKE 1 TABLET BY MOUTH EVERY DAY  30 tablet  1  . aspirin 81 MG tablet Take 81 mg by mouth daily.        . clopidogrel (PLAVIX) 75 MG tablet Take 1 tablet (75 mg total) by mouth daily.  30 tablet  11  . Fluticasone-Salmeterol (ADVAIR DISKUS) 250-50 MCG/DOSE AEPB 1 puff twice a day. Rinse mouth out well after each use  60 each  5  . guaiFENesin (MUCINEX) 600 MG 12 hr tablet Take 600 mg by mouth 2 (two) times daily.      Marland Kitchen lisinopril (PRINIVIL,ZESTRIL) 20 MG tablet TAKE 1 TABLET BY MOUTH EVERY DAY  90 tablet  3  . metoprolol tartrate (LOPRESSOR) 25 MG tablet TAKE 1 TABLET (25 MG TOTAL) BY MOUTH 2 (TWO) TIMES DAILY.  60 tablet  6  . NON FORMULARY Study meds.         Past Medical History  Diagnosis Date  . Arthropathy, unspecified, site unspecified   . Old myocardial infarction   . Other premature beats   . Chronic airway obstruction, not elsewhere classified   . Other and unspecified hyperlipidemia   . Essential hypertension, benign   . Coronary atherosclerosis of native coronary artery     Past Surgical History  Procedure Date  . Appendectomy   . Ptca     Family History  Problem Relation Age of Onset  . Heart disease Mother 75    CABG    History   Social History  . Marital Status: Divorced    Spouse Name: N/A    Number of Children: N/A  . Years of Education: N/A   Occupational History  . 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 Smoking status: Former Smoker -- 4.0 packs/day for 6 years    Types: Cigarettes    Quit date: 03/27/2006  . Smokeless tobacco: Not on file  .  Alcohol Use: No  . Drug Use: Not on file  . Sexually Active: Not on file   Other Topics Concern  . Not on file   Social History Narrative  . No narrative on file    Review of Systems:  All systems reviewed.  They are negative to the above problem except as previously stated.  Vital Signs: BP 100/60  Pulse 80  Ht 5\' 7"  (1.702 m)  Wt 170 lb (77.111 kg)  BMI 26.63 kg/m2  Physical Exam  HEENT:  Normocephalic, atraumatic. EOMI, PERRLA.  Neck: JVP is normal.  No bruits.  Lungs: clear to auscultation. No rales no wheezes.  Heart: Regular rate and rhythm. Normal S1, S2. No S3.   No significant murmurs. PMI not displaced.  Abdomen:  Supple, nontender. Normal bowel sounds. No masses. No hepatomegaly.  Extremities:   Good distal pulses throughout. No lower extremity edema.  Musculoskeletal :moving all extremities.  Neuro:   alert and oriented x3.  CN II-XII grossly intact.  EKG:  SR 80 bpm.  Occasional PAC.  Nonspecific ST T wave changes. Assessment and Plan:  1.  CAD.  No symptoms of angina.  Keep on same regimen Have told the patient to stop Plavix. 2.  HTN.  Good control.  Denies dizziness.  Check BMET.  3.  HL.  In a drug study.  Will try to get lipids.  COnfirm f/u  Platient plans to undergo ventral hernia repair.  From a cardiac standpoint he is at low risk.  OK to stop Plavix  Will do permanently. OK to stop ASA if necessary for short term.

## 2011-11-14 ENCOUNTER — Telehealth: Payer: Self-pay | Admitting: Internal Medicine

## 2011-11-14 LAB — BASIC METABOLIC PANEL
Calcium: 9.2 mg/dL (ref 8.4–10.5)
GFR: 58.5 mL/min — ABNORMAL LOW (ref >60.00)
Glucose, Bld: 127 mg/dL — ABNORMAL HIGH (ref 70–99)
Sodium: 138 mEq/L (ref 135–145)

## 2011-11-14 LAB — CBC WITH DIFFERENTIAL/PLATELET
Basophils Absolute: 0 10*3/uL (ref 0.0–0.1)
Hemoglobin: 14.5 g/dL (ref 13.0–17.0)
Lymphocytes Relative: 40.8 % (ref 12.0–46.0)
Monocytes Relative: 7.4 % (ref 3.0–12.0)
Neutro Abs: 4.5 10*3/uL (ref 1.4–7.7)
RBC: 4.76 Mil/uL (ref 4.22–5.81)
RDW: 14.6 % (ref 11.5–14.6)
WBC: 8.9 10*3/uL (ref 4.5–10.5)

## 2011-11-14 NOTE — Telephone Encounter (Signed)
Called patient back concerning elevated sugar level. He will cut out [facility] with sugar in his diet.

## 2011-11-14 NOTE — Telephone Encounter (Signed)
Please return call to patient [phone], he has questions about his sugar level.

## 2011-11-14 NOTE — Telephone Encounter (Signed)
LMOM for call back. 

## 2011-11-20 ENCOUNTER — Ambulatory Visit (INDEPENDENT_AMBULATORY_CARE_PROVIDER_SITE_OTHER): Payer: Medicare Other | Admitting: Adult Health

## 2011-11-20 ENCOUNTER — Telehealth (INDEPENDENT_AMBULATORY_CARE_PROVIDER_SITE_OTHER): Payer: Self-pay | Admitting: General Surgery

## 2011-11-20 ENCOUNTER — Encounter: Payer: Self-pay | Admitting: Adult Health

## 2011-11-20 VITALS — BP 112/70 | HR 68 | Temp 97.2°F | Ht 67.0 in | Wt 172.2 lb

## 2011-11-20 DIAGNOSIS — J449 Chronic obstructive pulmonary disease, unspecified: Secondary | ICD-10-CM

## 2011-11-20 NOTE — Telephone Encounter (Signed)
Pt calling to report he has previously met with Dr. Magnus Ivan about a hernia.  He has since that visit seen his cardiologist (who pt states has cleared him for surgery,) and has now seen his pulmonologist.  After discussion of his COPD with the physician, pt would like to delay repair of his hernia due to the pulmonary risk.  He will call back if he has increased pain with hernia

## 2011-11-20 NOTE — Patient Instructions (Addendum)
Continue on current regimen.  Call back mid Sept to see if Flu shot is here -want you to get as soon as possible  follow up Dr. Vassie Loll  In 3 months and As needed

## 2011-11-20 NOTE — Progress Notes (Signed)
  Subjective:    Patient ID: [name], male    DOB: 01-Mar-1946, 66 y.o.   MRN: [id number]  HPI   [name], ex smoker for FU of severe COPD - gold stg iV  46 PY smoker , quit 2008, when he had an MI  Can work in the yard x 20 mins, walk around the house or in the store  PFT showed FEv1 23% with good response to bronchodilator improving to 30%, air trapping & decreased diffusion c/w  Put on advair by [facility], taking once daily  emphysema.  Med review shows lisinopril  07/05/2010 Acute OV - restart advair  07/12/2011 spiriva helped a little bit in the past - does not want to try it due to fixed income    10/18/2011 Hernia repair planned - [facility] Coughing - cngestion , since Friday, uri c/o sob, dry cough (taking mucinex not helping), chest congestion, light headed, phlem feels stuck in his throat. denies any chest tx, wheezing Contacted by pulm rehab - wants to wait until after surgery CXR 4/13- hyperinflation, coarse intersttial markings as prior No excess use of 'rescue ' inhaler  >zpack and steroid rx   11/20/2011 Acute OV  1 month follow up.  Reports wheezing and occas DOE but overall breathing has improved since last OV.  C/o a lot of phelgm in chest x 1 wk.  Phelgm is white and difficult to get up at times  Last month with COPD flare , tx w/ zpack and steroid taper .  Cough is better. Uses mucinex which he thinks helps..  Is putting surgery on hold because he thinks he it is too risky .  Feels better and is walking more at mall.  No hemoptysis, edema, fever.      Review of Systems Constitutional:   No  weight loss, night sweats,  Fevers, chills, fatigue, or  lassitude.  HEENT:   No headaches,  Difficulty swallowing,  Tooth/dental problems, or  Sore throat,                No sneezing, itching, ear ache, nasal congestion, post nasal drip,   CV:  No chest pain,  Orthopnea, PND, swelling in lower extremities, anasarca, dizziness, palpitations, syncope.   GI  No  heartburn, indigestion, abdominal pain, nausea, vomiting, diarrhea, change in bowel habits, loss of appetite, bloody stools.   Resp:   No excess mucus, no productive cough,     No coughing up of blood.  No change in color of mucus.  No wheezing.  No chest wall deformity  Skin: no rash or lesions.  GU: no dysuria, change in color of urine, no urgency or frequency.  No flank pain, no hematuria   MS:  No joint pain or swelling.  No decreased range of motion.  No back pain.  Psych:  No change in mood or affect. No depression or anxiety.  No memory loss.          Objective:   Physical Exam   Gen. Pleasant, well-nourished, in no distress, tattoos + colorful ENT - no lesions, no post nasal drip Neck: No JVD, no thyromegaly, no carotid bruits Lungs: no use of accessory muscles, no dullness to percussion, decreased without rales or rhonchi  Cardiovascular: Rhythm regular, heart sounds  normal, no murmurs or gallops, no peripheral edema Musculoskeletal: No deformities, no cyanosis or clubbing        Assessment & Plan:

## 2011-11-20 NOTE — Assessment & Plan Note (Signed)
Recent flare now resolved  It does not appear that cough is lingering as he is on ACE  May need to consider changing if continues with flares requiring steroids   Plan Continue on current regimen.  Call back mid Sept to see if Flu shot is here -want you to get as soon as possible  follow up Dr. Vassie Loll  In 3 months and As needed

## 2011-11-24 ENCOUNTER — Other Ambulatory Visit (INDEPENDENT_AMBULATORY_CARE_PROVIDER_SITE_OTHER): Payer: Self-pay | Admitting: Surgery

## 2011-12-25 ENCOUNTER — Other Ambulatory Visit: Payer: Self-pay | Admitting: Internal Medicine

## 2012-01-13 ENCOUNTER — Other Ambulatory Visit: Payer: Self-pay | Admitting: Internal Medicine

## 2012-01-29 ENCOUNTER — Other Ambulatory Visit: Payer: Self-pay | Admitting: Internal Medicine

## 2012-02-19 ENCOUNTER — Encounter: Payer: Self-pay | Admitting: Pulmonary Disease

## 2012-02-19 ENCOUNTER — Ambulatory Visit (INDEPENDENT_AMBULATORY_CARE_PROVIDER_SITE_OTHER): Payer: Medicare Other | Admitting: Pulmonary Disease

## 2012-02-19 VITALS — BP 108/60 | HR 68 | Temp 97.4°F | Ht 67.0 in | Wt 176.0 lb

## 2012-02-19 DIAGNOSIS — J4489 Other specified chronic obstructive pulmonary disease: Secondary | ICD-10-CM

## 2012-02-19 DIAGNOSIS — I10 Essential (primary) hypertension: Secondary | ICD-10-CM

## 2012-02-19 DIAGNOSIS — J449 Chronic obstructive pulmonary disease, unspecified: Secondary | ICD-10-CM

## 2012-02-19 MED ORDER — NEBIVOLOL HCL 10 MG PO TABS: 20.0000 mg | ORAL_TABLET | Freq: Every day | ORAL | Status: DC

## 2012-02-19 NOTE — Assessment & Plan Note (Signed)
STOP taking lisinopril Take benicar 20 mg instead - samples Call us if BP runs too high in 1-2 weeks & we can send Rx if this works

## 2012-02-19 NOTE — Progress Notes (Signed)
  Subjective:    Patient ID: [name], male    DOB: 1946-01-21, 66 y.o.   MRN: [id number]  HPI [name], ex smoker for FU of severe COPD - gold stg iV  46 PY smoker , quit 2008, when he had an MI  Can work in the yard x 20 mins, walk around the house or in the store  PFT showed FEv1 23% with good response to bronchodilator improving to 30%, air trapping & decreased diffusion c/w  Put on advair by [facility], taking once daily   07/05/2010 Acute OV - restart advair  07/12/2011 spiriva helped a little bit in the past - does not want to try it due to fixed income    02/19/2012 26m FU breathing is good. cough w/ green-yellow phlem, wheezing, slight PND. no chest tx has a cold x 1 week. no chills, sweats, fever,  Med review shows lisinopril  Wants to know if he can build up his immune system Off plavix He deferred Hernia repair - [facility]  Not started pulm rehab yet CXR 4/13- hyperinflation, coarse intersttial markings as prior  No excess use of 'rescue ' inhaler      Review of Systems neg for any significant sore throat, dysphagia, itching, sneezing, nasal congestion or excess/ purulent secretions, fever, chills, sweats, unintended wt loss, pleuritic or exertional cp, hempoptysis, orthopnea pnd or change in chronic leg swelling. Also denies presyncope, palpitations, heartburn, abdominal pain, nausea, vomiting, diarrhea or change in bowel or urinary habits, dysuria,hematuria, rash, arthralgias, visual complaints, headache, numbness weakness or ataxia.     Objective:   Physical Exam  Gen. Pleasant, well-nourished, in no distress ENT - no lesions, no post nasal drip Neck: No JVD, no thyromegaly, no carotid bruits Lungs: no use of accessory muscles, no dullness to percussion, clear without rales or rhonchi  Cardiovascular: Rhythm regular, heart sounds  normal, no murmurs or gallops, no peripheral edema Musculoskeletal: No deformities, no cyanosis or clubbing  Abd- ventral hernia,  soft, non tender       Assessment & Plan:

## 2012-02-19 NOTE — Patient Instructions (Addendum)
STOP taking lisinopril Take benicar 20 mg instead - samples Call us if BP runs too high in 1-2 weeks & we can send Rx if this works Stay on [employer] will benefit from an exercise program

## 2012-02-19 NOTE — Assessment & Plan Note (Addendum)
Stay on advair Does not want to add spiriva due to cost He will benefit from pulm rehab

## 2012-02-26 ENCOUNTER — Encounter (INDEPENDENT_AMBULATORY_CARE_PROVIDER_SITE_OTHER): Payer: Medicare Other

## 2012-02-26 DIAGNOSIS — R0989 Other specified symptoms and signs involving the circulatory and respiratory systems: Secondary | ICD-10-CM

## 2012-03-04 ENCOUNTER — Other Ambulatory Visit: Payer: Self-pay | Admitting: Internal Medicine

## 2012-04-11 ENCOUNTER — Telehealth: Payer: Self-pay | Admitting: Internal Medicine

## 2012-04-11 NOTE — Telephone Encounter (Signed)
Last seen by Dr.Ross on 11/13/2011. Not in recalls. Will ask Dr.Ross and call him back.

## 2012-04-11 NOTE — Telephone Encounter (Signed)
New Problem:    Patient called in wanting to know when he was due in for another appointment.  Please call back.

## 2012-04-12 ENCOUNTER — Telehealth: Payer: Self-pay | Admitting: Internal Medicine

## 2012-04-12 NOTE — Telephone Encounter (Signed)
New problem:   Receive message from [facility] regarding placing patient on recall for August  2014.   Recall is attached to patient profile.

## 2012-04-12 NOTE — Telephone Encounter (Signed)
See him back next August

## 2012-04-12 NOTE — Telephone Encounter (Signed)
Called patient back. He is aware that he will get a letter over the summer to schedule an appointment for August.

## 2012-05-19 ENCOUNTER — Other Ambulatory Visit: Payer: Self-pay | Admitting: Internal Medicine

## 2012-05-22 ENCOUNTER — Ambulatory Visit: Payer: Medicare Other | Admitting: Adult Health

## 2012-05-24 ENCOUNTER — Encounter: Payer: Self-pay | Admitting: Adult Health

## 2012-05-24 ENCOUNTER — Ambulatory Visit (INDEPENDENT_AMBULATORY_CARE_PROVIDER_SITE_OTHER): Payer: Medicare Other | Admitting: Adult Health

## 2012-05-24 VITALS — BP 100/70 | HR 70 | Temp 98.3°F | Ht 67.0 in | Wt 181.2 lb

## 2012-05-24 DIAGNOSIS — I10 Essential (primary) hypertension: Secondary | ICD-10-CM

## 2012-05-24 DIAGNOSIS — J449 Chronic obstructive pulmonary disease, unspecified: Secondary | ICD-10-CM

## 2012-05-24 MED ORDER — LOSARTAN POTASSIUM 50 MG PO TABS: 50.0000 mg | ORAL_TABLET | Freq: Every day | ORAL | Status: DC

## 2012-05-24 MED ORDER — FLUTICASONE-SALMETEROL 250-50 MCG/DOSE IN AEPB: INHALATION_SPRAY | RESPIRATORY_TRACT | Status: DC

## 2012-05-24 NOTE — Progress Notes (Signed)
  Subjective:    Patient ID: [name], male    DOB: 04/20/1945, 67 y.o.   MRN: [id number]  HPI  [name], ex smoker for FU of severe COPD - gold stg iV  46 PY smoker , quit 2008, when he had an MI  Can work in the yard x 20 mins, walk around the house or in the store  PFT showed FEv1 23% with good response to bronchodilator improving to 30%, air trapping & decreased diffusion c/w  Put on advair by [facility], taking once daily   07/05/2010 Acute OV - restart advair  07/12/2011 spiriva helped a little bit in the past - does not want to try it due to fixed income    02/18/13  23m FU breathing is good. cough w/ green-yellow phlem, wheezing, slight PND. no chest tx has a cold x 1 week. no chills, sweats, fever,  Med review shows lisinopril  Wants to know if he can build up his immune system Off plavix He deferred Hernia repair - [facility]  Not started pulm rehab yet CXR 4/13- hyperinflation, coarse intersttial markings as prior  No excess use of 'rescue ' inhaler  >changed ACE to ARB  05/24/2012 COPD followup. Patient returns for a three-month followup for COPD. Patient reports his breathing is at baseline, without any increased shortness of breath. He denies any increase rescue inhaler use. He had no emergency room or hospitalization admissions. Last visit. Patient was changed from his ACE inhibitor over to Benicar. D/t  recurrent cough and frequent COPD flares. Unfortunately patient misunderstood the instructions, and did not stop his ACE inhibitor. He does have a cough from time to time and had an intermittent wheezy sound in his throat. He denies any hemoptysis, orthopnea, PND, leg swelling, fever, nausea, vomiting, or weight loss.      Review of Systems  neg for any significant sore throat, dysphagia, itching, sneezing, nasal congestion or excess/ purulent secretions, fever, chills, sweats, unintended wt loss, pleuritic or exertional cp, hempoptysis, orthopnea pnd or change  in chronic leg swelling. Also denies presyncope, palpitations, heartburn, abdominal pain, nausea, vomiting, diarrhea or change in bowel or urinary habits, dysuria,hematuria, rash, arthralgias, visual complaints, headache, numbness weakness or ataxia.     Objective:   Physical Exam   Gen. Pleasant, well-nourished, in no distress ENT - no lesions, no post nasal drip Neck: No JVD, no thyromegaly, no carotid bruits Lungs: no use of accessory muscles, no dullness to percussion, clear without rales or rhonchi , faint upper airway psuedowheeze on forced exp .  Cardiovascular: Rhythm regular, heart sounds  normal, no murmurs or gallops, no peripheral edema Musculoskeletal: No deformities, no cyanosis or clubbing  Abd- ventral hernia, soft, non tender       Assessment & Plan:

## 2012-05-24 NOTE — Patient Instructions (Addendum)
STOP taking lisinopril Begin Losartan 50mg  daily (in place of Lisinopril)  Continue on  advair 1 puff Twice daily  , brush/rinse /gargle after inhaler use.  follow up Dr. Vassie Loll  In 4 months and As needed

## 2012-05-24 NOTE — Assessment & Plan Note (Signed)
Change ACE to ARB due to recurrent cough tendency   Plan  STOP taking lisinopril Begin Losartan 50mg  daily (in place of Lisinopril)  Follow up Dr. Vassie Loll  In 4 months and As needed   follow up with PCP for b/;p

## 2012-05-24 NOTE — Assessment & Plan Note (Signed)
Compensated on current regimen. Patient has a tendency to exacerbations and recurrent cough.  Patient misunderstood instructions last ov  to discontinue ACE inhibitor.  He is advised to change his ACE inhibitor over to losartan   followup in 3-4 months with Dr. Elbert Ewings. And as needed

## 2012-07-06 ENCOUNTER — Other Ambulatory Visit: Payer: Self-pay | Admitting: Pulmonary Disease

## 2012-07-15 ENCOUNTER — Other Ambulatory Visit: Payer: Self-pay | Admitting: Internal Medicine

## 2012-07-26 ENCOUNTER — Ambulatory Visit (INDEPENDENT_AMBULATORY_CARE_PROVIDER_SITE_OTHER): Payer: Medicare Other | Admitting: Pulmonary Disease

## 2012-07-26 ENCOUNTER — Encounter: Payer: Self-pay | Admitting: Pulmonary Disease

## 2012-07-26 VITALS — BP 130/78 | HR 84 | Temp 97.3°F | Wt 178.0 lb

## 2012-07-26 DIAGNOSIS — J441 Chronic obstructive pulmonary disease with (acute) exacerbation: Secondary | ICD-10-CM

## 2012-07-26 DIAGNOSIS — J4489 Other specified chronic obstructive pulmonary disease: Secondary | ICD-10-CM

## 2012-07-26 DIAGNOSIS — J449 Chronic obstructive pulmonary disease, unspecified: Secondary | ICD-10-CM

## 2012-07-26 MED ORDER — ALBUTEROL SULFATE HFA 108 (90 BASE) MCG/ACT IN AERS: 2.0000 | INHALATION_SPRAY | Freq: Four times a day (QID) | RESPIRATORY_TRACT | Status: DC | PRN

## 2012-07-26 MED ORDER — PREDNISONE 10 MG PO TABS: ORAL_TABLET | ORAL | Status: DC

## 2012-07-26 MED ORDER — LEVOFLOXACIN 750 MG PO TABS: 750.0000 mg | ORAL_TABLET | Freq: Every day | ORAL | Status: DC

## 2012-07-26 NOTE — Progress Notes (Signed)
  Subjective:    Patient ID: [name], male    DOB: 1945/04/20, 67 y.o.   MRN: [id number]  HPI The patient comes in today for an acute sick visit.  He is normally followed by Dr. Vassie Loll for significant COPD.  He gives a two-week history of worsening chest congestion, cough, and purulent mucus along with shortness of breath.  His primary care physician called in a Z-Pak and a short prednisone taper, but the patient has had only a partial response.  He continues to have significant dyspnea on exertion, just walking through his house.  He does have a history of coronary disease, but is not having classic angina.  He is having a persistent cough with rattling congestion, as well as discolored mucus.  He is not having any fevers or chills.  He denies worsening lower extremity edema, and has not had any pleuritic chest pain.   Review of Systems  Constitutional: Positive for fatigue ( low energy). Negative for fever and unexpected weight change.  HENT: Negative for ear pain, nosebleeds, congestion, sore throat, rhinorrhea, sneezing, trouble swallowing, dental problem, postnasal drip and sinus pressure.   Eyes: Negative for redness and itching.  Respiratory: Positive for shortness of breath. Negative for cough, chest tightness and wheezing.   Cardiovascular: Negative for palpitations and leg swelling.  Gastrointestinal: Negative for nausea and vomiting.  Genitourinary: Negative for dysuria.  Musculoskeletal: Negative for joint swelling.  Skin: Negative for rash.  Neurological: Negative for headaches.  Hematological: Does not bruise/bleed easily.  Psychiatric/Behavioral: Negative for dysphoric mood. The patient is not nervous/anxious.        Objective:   Physical Exam Frail-appearing male in no acute distress Nose without purulent discharge noted Oropharynx clear Neck without lymphadenopathy or thyromegaly Chest with very decreased breath sounds throughout, difficult to hear for active  wheezing, no crackles Cardiac exam with regular rate and rhythm Lower extremities without edema, no cyanosis Alert and oriented, moves all 4 extremities.           Assessment & Plan:

## 2012-07-26 NOTE — Addendum Note (Signed)
Addended by: Nita Sells on: 07/26/2012 10:53 AM   Modules accepted: Orders

## 2012-07-26 NOTE — Assessment & Plan Note (Addendum)
The patient is having persistent chest congestion and cough productive of discolored mucus despite being on a Z-Pak and prednisone taper last week.  He is also having a significant increase in his dyspnea on exertion above baseline.  Surprisingly, he did not desat with walking today.   I suspect this is a COPD exacerbation related to acute bronchitis that has been in adequately treated, but I think we also need to keep in mind cardiac ischemia with his known history of coronary disease.  I will treat him with a broader spectrum antibiotic as well as another prednisone taper, and would like to see him back next week to check on his progress.  If he is continuing to have severe dyspnea on exertion, he will need a chest x-ray and possibly a cardiac evaluation.

## 2012-07-26 NOTE — Assessment & Plan Note (Signed)
The patient currently is on a LABA/ICS alone, and has been on anti-cholinergics in the past with unclear results.  Perhaps he would benefit from another trial of a LAMA.

## 2012-07-26 NOTE — Patient Instructions (Addendum)
Will treat you with a short course of prednisone, and also levaquin 750mg  one a day for 7 days Will give you a prescription for a rescue inhaler Would like to have you come back and see either Dr. Vassie Loll or our nurse practitioner next week to check on things.

## 2012-08-01 ENCOUNTER — Ambulatory Visit (INDEPENDENT_AMBULATORY_CARE_PROVIDER_SITE_OTHER): Payer: Medicare Other | Admitting: Adult Health

## 2012-08-01 ENCOUNTER — Ambulatory Visit (INDEPENDENT_AMBULATORY_CARE_PROVIDER_SITE_OTHER)
Admission: RE | Admit: 2012-08-01 | Discharge: 2012-08-01 | Disposition: A | Discharge status: Home / Self Care | Payer: Medicare Other | Source: Ambulatory Visit | Attending: Adult Health | Admitting: Adult Health

## 2012-08-01 VITALS — BP 132/64 | HR 76 | Temp 97.8°F | Ht 67.0 in | Wt 177.6 lb

## 2012-08-01 DIAGNOSIS — J441 Chronic obstructive pulmonary disease with (acute) exacerbation: Secondary | ICD-10-CM

## 2012-08-01 MED ORDER — LEVALBUTEROL HCL 0.63 MG/3ML IN NEBU
0.6300 mg | INHALATION_SOLUTION | Freq: Once | RESPIRATORY_TRACT | Status: AC
  Administered 2012-08-01: 0.63 mg via RESPIRATORY_TRACT

## 2012-08-01 MED ORDER — IPRATROPIUM BROMIDE 0.02 % IN SOLN: 500.0000 ug | Freq: Four times a day (QID) | RESPIRATORY_TRACT | Status: DC

## 2012-08-01 MED ORDER — ALBUTEROL SULFATE (2.5 MG/3ML) 0.083% IN NEBU: 2.5000 mg | INHALATION_SOLUTION | Freq: Four times a day (QID) | RESPIRATORY_TRACT | Status: DC | PRN

## 2012-08-01 NOTE — Progress Notes (Signed)
  Subjective:    Patient ID: [name], male    DOB: 1946-02-13, 67 y.o.   MRN: [id number]  HPI  [name], ex smoker for FU of severe COPD - gold stg iV  46 PY smoker , quit 2008, when he had an MI  Can work in the yard x 20 mins, walk around the house or in the store  PFT showed FEv1 23% with good response to bronchodilator improving to 30%, air trapping & decreased diffusion c/w  Put on advair by [employer], taking once daily   07/05/2010 Acute OV - restart advair  07/12/2011 spiriva helped a little bit in the past - does not want to try it due to fixed income    02/18/13  39m FU breathing is good. cough w/ green-yellow phlem, wheezing, slight PND. no chest tx has a cold x 1 week. no chills, sweats, fever,  Med review shows lisinopril  Wants to know if he can build up his immune system Off plavix He deferred Hernia repair - [facility]  Not started pulm rehab yet CXR 4/13- hyperinflation, coarse intersttial markings as prior  No excess use of 'rescue ' inhaler  >changed ACE to ARB  05/24/2012 COPD followup. Patient returns for a three-month followup for COPD. Patient reports his breathing is at baseline, without any increased shortness of breath. He denies any increase rescue inhaler use. He had no emergency room or hospitalization admissions. Last visit. Patient was changed from his ACE inhibitor over to Benicar. D/t  recurrent cough and frequent COPD flares. Unfortunately patient misunderstood the instructions, and did not stop his ACE inhibitor. He does have a cough from time to time and had an intermittent wheezy sound in his throat. He denies any hemoptysis, orthopnea, PND, leg swelling, fever, nausea, vomiting, or weight loss. >ace stopped   08/01/2012 Follow up  Returns for 1 week follow up  Seen last week for COPD flare , tx w/ Levaquin and steroid taper . Feels better still has some lingering thick mucus.  We discussed adding back spiriva to his current regimen of Spiriva  -difficult to afford.  No chest pain , orthopnea or edema.        Review of Systems  neg for any significant sore throat, dysphagia, itching, sneezing, nasal congestion or excess/ purulent secretions, fever, chills, sweats, unintended wt loss, pleuritic or exertional cp, hempoptysis, orthopnea pnd or change in chronic leg swelling. Also denies presyncope, palpitations, heartburn, abdominal pain, nausea, vomiting, diarrhea or change in bowel or urinary habits, dysuria,hematuria, rash, arthralgias, visual complaints, headache, numbness weakness or ataxia.     Objective:   Physical Exam   Gen. Pleasant, well-nourished, in no distress ENT - no lesions, no post nasal drip Neck: No JVD, no thyromegaly, no carotid bruits Lungs: no use of accessory muscles, no dullness to percussion, clear without rales or rhonchi ,\diminshed bs in bases  Cardiovascular: Rhythm regular, heart sounds  normal, no murmurs or gallops, no peripheral edema Musculoskeletal: No deformities, no cyanosis or clubbing  Abd- ventral hernia, soft, non tender       Assessment & Plan:

## 2012-08-01 NOTE — Addendum Note (Signed)
Addended by: Boone Master E on: 08/01/2012 05:26 PM   Modules accepted: Orders

## 2012-08-01 NOTE — Patient Instructions (Addendum)
Begin Ipratropium Neb Four times a day   May use Albuterol Neb every 4 hr as needed for wheezing/shortness of breath- this is your rescue med Continue on Advair 1 puff Twice daily   follow up Dr. Vassie Loll  In 2-3 months and As needed   Please contact office for sooner follow up if symptoms do not improve or worsen or seek emergency care

## 2012-08-01 NOTE — Assessment & Plan Note (Signed)
Recent flare now resolving  Will add back anticholinergic - unable to afford spirva -begin atrovent nebs cxr -NAD   Plan  Begin Ipratropium Neb Four times a day   May use Albuterol Neb every 4 hr as needed for wheezing/shortness of breath- this is your rescue med Continue on Advair 1 puff Twice daily   follow up Dr. Vassie Loll  In 2-3 months and As needed   Please contact office for sooner follow up if symptoms do not improve or worsen or seek emergency care

## 2012-08-04 ENCOUNTER — Other Ambulatory Visit: Payer: Self-pay | Admitting: Internal Medicine

## 2012-08-05 ENCOUNTER — Telehealth: Payer: Self-pay

## 2012-08-05 ENCOUNTER — Other Ambulatory Visit: Payer: Self-pay

## 2012-08-05 MED ORDER — METOPROLOL TARTRATE 25 MG PO TABS: ORAL_TABLET | ORAL | Status: DC

## 2012-08-05 NOTE — Telephone Encounter (Signed)
pt called to rqst refill for [facility]oprolol [facility]. refill completed 60 R-3

## 2012-08-06 NOTE — Progress Notes (Signed)
Quick Note:  lmomtcb on pt's home and cell #s ______ 

## 2012-08-08 ENCOUNTER — Telehealth: Payer: Self-pay | Admitting: Pulmonary Disease

## 2012-08-08 NOTE — Telephone Encounter (Signed)
Pt calling in ref to previous msg can be reached at [phone] or [phone].[name]

## 2012-08-08 NOTE — Telephone Encounter (Signed)
I spoke with the pt and he states that he wants the rx for albuterol cancelled because he has received tis [facility] and he has a lot at his home and does not use it often so does not want to keep getting shipments. I advised the pt I will contact [facility] and advise. I spoke with Larita Fife at [facility] and she will cancel this rx. Carron Curie, CMA

## 2012-08-14 NOTE — Progress Notes (Signed)
Quick Note:  ATC patient on home and cell number No answer on either LMOMTCB ______ 

## 2012-08-15 ENCOUNTER — Telehealth: Payer: Self-pay | Admitting: *Deleted

## 2012-08-15 ENCOUNTER — Encounter: Payer: Self-pay | Admitting: Adult Health

## 2012-08-15 DIAGNOSIS — J449 Chronic obstructive pulmonary disease, unspecified: Secondary | ICD-10-CM

## 2012-08-15 NOTE — Telephone Encounter (Signed)
Received 5.19.14 ONO results from [facility] Per TP: begin O2 at 2lpm at bedtime  LMOM TCB x1 on both the home and cell numbers Order placed for the O2 with mention that pt is not yet aware of results Will forward back to my inbasket to follow up on / await pt's return call

## 2012-08-16 NOTE — Telephone Encounter (Signed)
LMOM TCB x2 to follow up It looks like the order was faxed this morning Called [facility], they are closed

## 2012-08-20 NOTE — Telephone Encounter (Signed)
I spoke with the patient and he is aware of results. Carron Curie, CMA

## 2012-08-21 NOTE — Progress Notes (Signed)
Quick Note:  Pt aware ______ 

## 2012-08-23 ENCOUNTER — Encounter (INDEPENDENT_AMBULATORY_CARE_PROVIDER_SITE_OTHER): Payer: Medicare Other

## 2012-08-23 ENCOUNTER — Other Ambulatory Visit: Payer: Self-pay | Admitting: *Deleted

## 2012-08-23 DIAGNOSIS — R0989 Other specified symptoms and signs involving the circulatory and respiratory systems: Secondary | ICD-10-CM

## 2012-08-23 MED ORDER — SIMVASTATIN 40 MG PO TABS: 40.0000 mg | ORAL_TABLET | Freq: Every day | ORAL | Status: DC

## 2012-08-26 ENCOUNTER — Telehealth: Payer: Self-pay | Admitting: Pulmonary Disease

## 2012-08-26 NOTE — Telephone Encounter (Signed)
Called spoke with patient who stated that he does NOT want his O2 d/c'd > "he wants to do what the doctor wants him to" Concerned about his long cannula tubing as he tripped over this last night and would like recommendations Pt sleeps in the living room with the concentrator near the bed (sofa-sleeper) Pt will maneuver the tubing behind the side-table and couch to decrease his chances of tripping over it Pt will call his DME company if needed to see if they can provide a shorter cannula Nothing further needed; will sign off.

## 2012-08-26 NOTE — Telephone Encounter (Signed)
Pt called back & asked to speak w/ TP's nurse again.  Antionette Fairy

## 2012-08-26 NOTE — Telephone Encounter (Signed)
I spoke with pt and was checking on this. I advised him we are checking are with TP he voiced his understanding

## 2012-08-26 NOTE — Telephone Encounter (Signed)
Duplicate message see phone note from earlier. Same concern.

## 2012-08-26 NOTE — Telephone Encounter (Signed)
I spoke with pt and he stated he can't use the oxygen at night. He stated he almost feel in the middle of night and broke his neck over the tubing. He states it keeps him awake during the night and the light flickers on it all night. He does not want this and wants this to have this picked up. Please advise TP thanks

## 2012-08-30 ENCOUNTER — Telehealth: Payer: Self-pay | Admitting: Adult Health

## 2012-08-30 NOTE — Telephone Encounter (Signed)
Spoke with patient, patient states his lips have been puffy and swollen on and off x 1week Puffiness and swelling of lips are not effecting his breathing Occurs at no particular time Patient concerned if this is related to the start of the nebulizers Requesting rec per Dr. Vassie Loll, thanks  Last saw TP 08/01/12: Patient Instructions    Begin Ipratropium Neb Four times a day  May use Albuterol Neb every 4 hr as needed for wheezing/shortness of breath- this is your rescue med  Continue on Advair 1 puff Twice daily  follow up Dr. Vassie Loll In 2-3 months and As needed  Please contact office for sooner follow up if symptoms do not improve or worsen or seek emergency care    Next OV:09/30/12  No Known Allergies

## 2012-08-30 NOTE — Telephone Encounter (Signed)
Pt states she did start a new cholesterol medication 1 week ago. He stated he did not even think about this and will call his PCP. Pt is aware of RA recs though. He voiced his understanding and needed nothing further

## 2012-08-30 NOTE — Telephone Encounter (Signed)
Has he started on any other new meds? If not, then he can stop the atrovent (seems like this was added) & continue on the abuterol

## 2012-09-13 ENCOUNTER — Encounter: Payer: Self-pay | Admitting: Internal Medicine

## 2012-09-30 ENCOUNTER — Ambulatory Visit (INDEPENDENT_AMBULATORY_CARE_PROVIDER_SITE_OTHER): Payer: Medicare Other | Admitting: Pulmonary Disease

## 2012-09-30 ENCOUNTER — Encounter: Payer: Self-pay | Admitting: Pulmonary Disease

## 2012-09-30 VITALS — BP 122/76 | HR 70 | Temp 98.2°F | Ht 67.0 in | Wt 180.0 lb

## 2012-09-30 DIAGNOSIS — J449 Chronic obstructive pulmonary disease, unspecified: Secondary | ICD-10-CM

## 2012-09-30 DIAGNOSIS — J4489 Other specified chronic obstructive pulmonary disease: Secondary | ICD-10-CM

## 2012-09-30 NOTE — Progress Notes (Signed)
  Subjective:    Patient ID: [name], male    DOB: [birth year], 67 y.o.   MRN: [id number]  HPI  [name], ex smoker for FU of severe COPD - gold stg iV  46 PY smoker , quit 2008, when he had an MI  Can work in the yard x 20 mins, walk around the house or in the store  PFT showed FEv1 23% with good response to bronchodilator improving to 30%, air trapping & decreased diffusion c/w  Put on advair by [facility], taking once daily    07/05/2010 Acute OV - restart advair  07/12/2011 spiriva helped a little bit in the past - does not want to try it due to fixed income  He deferred Hernia repair - [facility]  Not interested in pulm rehab    05/24/2012 >ace stopped    09/30/2012  07/2012 COPD flare , tx w/ Levaquin and steroid taper  5.19.14 ONO results from [facility]  Per TP: begin O2 at 2lpm at bedtime Concerned about his long cannula tubing as he tripped over this last night and would like recommendations  Pt sleeps in the living room with the concentrator near the bed (sofa-sleeper)  Pt will maneuver the tubing behind the side-table and couch to decrease his chances of tripping over it  Pt will call his DME company if needed to see if they can provide a shorter cannula  his lips have been puffy and swollen on and off x 1week  Puffiness and swelling of lips are not effecting his breathing  Occurs at no particular time  Patient concerned if this is related to the start of the nebulizers  Started cholesterol medication Pt states his breathing has been "great". He likes current regimen of alb nebs 4 times/d Did not see any improvement with o2- would like to stop Denies any wheezing, no chest tx, no wheezing. If he does he uses his rescue inhaler and it helps.   Review of Systems  neg for any significant sore throat, dysphagia, itching, sneezing, nasal congestion or excess/ purulent secretions, fever, chills, sweats, unintended wt loss, pleuritic or exertional cp, hempoptysis, orthopnea pnd  or change in chronic leg swelling. Also denies presyncope, palpitations, heartburn, abdominal pain, nausea, vomiting, diarrhea or change in bowel or urinary habits, dysuria,hematuria, rash, arthralgias, visual complaints, headache, numbness weakness or ataxia.     Objective:   Physical Exam  Gen. Pleasant, well-nourished, in no distress ENT - no lesions, no post nasal drip Neck: No JVD, no thyromegaly, no carotid bruits Lungs: no use of accessory muscles, no dullness to percussion, clear without rales or rhonchi  Cardiovascular: Rhythm regular, heart sounds  normal, no murmurs or gallops, no peripheral edema Musculoskeletal: No deformities, no cyanosis or clubbing         Assessment & Plan:

## 2012-09-30 NOTE — Patient Instructions (Addendum)
Will stop oxygen on your request Use albuterol nebs 4 times daily - we will keep you breathing meds to a minimim on your request Call for symptoms of chest cold

## 2012-09-30 NOTE — Assessment & Plan Note (Signed)
Will stop oxygen on your request Use albuterol nebs 4 times daily - we will keep you breathing meds to a minimim on your request Call for symptoms of chest cold 

## 2012-10-10 ENCOUNTER — Ambulatory Visit: Payer: Medicare Other | Admitting: Internal Medicine

## 2012-11-03 ENCOUNTER — Other Ambulatory Visit: Payer: Self-pay | Admitting: Adult Health

## 2012-11-06 NOTE — Telephone Encounter (Signed)
Losartan rx written at 2.28.14 ov with TP - pt advised to follow up with PCP at that visit.  #30 w/ 0 refills sent to pharmacy.

## 2012-11-11 ENCOUNTER — Encounter: Payer: Self-pay | Admitting: Adult Health

## 2012-11-11 ENCOUNTER — Telehealth: Payer: Self-pay | Admitting: *Deleted

## 2012-11-11 NOTE — Telephone Encounter (Signed)
Received fax from [facility] Per documentation, pt has received influenza vaccine on 8.16.14 Immunization documented Fax sent to scan

## 2012-11-30 ENCOUNTER — Other Ambulatory Visit: Payer: Self-pay | Admitting: Internal Medicine

## 2012-12-05 ENCOUNTER — Ambulatory Visit (INDEPENDENT_AMBULATORY_CARE_PROVIDER_SITE_OTHER): Payer: Medicare Other | Admitting: Internal Medicine

## 2012-12-05 ENCOUNTER — Encounter: Payer: Self-pay | Admitting: Internal Medicine

## 2012-12-05 VITALS — BP 129/69 | HR 73 | Ht 67.0 in | Wt 185.0 lb

## 2012-12-05 DIAGNOSIS — E785 Hyperlipidemia, unspecified: Secondary | ICD-10-CM

## 2012-12-05 DIAGNOSIS — I2510 Atherosclerotic heart disease of native coronary artery without angina pectoris: Secondary | ICD-10-CM

## 2012-12-05 NOTE — Progress Notes (Signed)
Marland Kitchen HPI Patient is a 67 year old iwht a history of CAD (s/p NSTEMI in 2008 with PTCA/DES to RCA).  I saw him in clinic in Fall 2013 Since seen he has done well.  No CP  Breathing is overall OK  Notes occasional wheeze. Remains active.  No Known Allergies  Current Outpatient Prescriptions  Medication Sig Dispense Refill  . albuterol (PROVENTIL HFA;VENTOLIN HFA) 108 (90 BASE) MCG/ACT inhaler Inhale 2 puffs into the lungs every 6 (six) hours as needed for wheezing (shortness of breath).  1 Inhaler  6  . albuterol (PROVENTIL) (2.5 MG/3ML) 0.083% nebulizer solution Take 3 mLs (2.5 mg total) by nebulization every 6 (six) hours as needed for wheezing or shortness of breath. Dx: 491.21  75 mL  12  . amLODipine (NORVASC) 5 MG tablet TAKE 1 TABLET BY MOUTH EVERY DAY  30 tablet  3  . aspirin 81 MG tablet Take 81 mg by mouth daily.        . Fluticasone-Salmeterol (ADVAIR DISKUS) 250-50 MCG/DOSE AEPB 1 puff twice a day. Rinse mouth out well after each use  60 each  5  . losartan (COZAAR) 50 MG tablet TAKE 1 TABLET (50 MG TOTAL) BY MOUTH DAILY.  30 tablet  0  . metoprolol tartrate (LOPRESSOR) 25 MG tablet TAKE 1 TABLET (25 MG TOTAL) BY MOUTH 2 (TWO) TIMES DAILY.  60 tablet  6  . simvastatin (ZOCOR) 40 MG tablet Take 1 tablet by mouth every evening.      Marland Kitchen guaiFENesin (MUCINEX) 600 MG 12 hr tablet Take 600 mg by mouth 2 (two) times daily as needed.        No current facility-administered medications for this visit.    Past Medical History  Diagnosis Date  . Arthropathy, unspecified, site unspecified   . Old myocardial infarction   . Other premature beats   . Chronic airway obstruction, not elsewhere classified   . Other and unspecified hyperlipidemia   . Essential hypertension, benign   . Coronary atherosclerosis of native coronary artery     Past Surgical History  Procedure Laterality Date  . Appendectomy    . Ptca      Family History  Problem Relation Age of Onset  . Heart disease Mother  68    CABG    History   Social History  . Marital Status: Divorced    Spouse Name: N/A    Number of Children: N/A  . Years of Education: N/A   Occupational History  . 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 Smoking status: Former Smoker -- 2.00 packs/day for 40 years    Types: Cigarettes, Pipe, Cigars    Quit date: 03/27/2006  . Smokeless tobacco: Never Used  . Alcohol Use: No  . Drug Use: Not on file  . Sexual Activity: Not on file   Other Topics Concern  . Not on file   Social History Narrative  . No narrative on file    Review of Systems:  All systems reviewed.  They are negative to the above problem except as previously stated.  Vital Signs: BP 129/69  Pulse 73  Ht 5\' 7"  (1.702 m)  Wt 185 lb (83.915 kg)  BMI 28.97 kg/m2  Physical Exam Patient is in NAD HEENT:  Normocephalic, atraumatic. EOMI, PERRLA.  Neck: JVP is normal.  No bruits.  Lungs: Decreased airflow.. No rales no wheezes.  Heart: Regular rate and  rhythm. Normal S1, S2. No S3.   No significant murmurs. PMI not displaced.  Abdomen:  Supple, nontender. Normal bowel sounds. No masses. No hepatomegaly.  Extremities:   Good distal pulses throughout. No lower extremity edema.  Musculoskeletal :moving all extremities.  Neuro:   alert and oriented x3.  CN II-XII grossly intact.  EKG:  SR 72 bpm.  Nonspecific ST T wave changes. Assessment and Plan:  1.  CAD.  No symptoms of angina.  Keep on same regimen 2.  HTN.  Good control.   3.  HL. WIll need to get lipids checked

## 2012-12-05 NOTE — Patient Instructions (Addendum)
Your physician recommends that you return for lab work at the [place] office on 01/02/13 for fasting cholesterol labs  Your physician wants you to follow-up in: 1 year with Dr. Tenny Craw.  You will receive a reminder letter in the mail two months in advance. If you don't receive a letter, please call our office to schedule the follow-up appointment.

## 2012-12-10 ENCOUNTER — Other Ambulatory Visit: Payer: Self-pay | Admitting: Adult Health

## 2012-12-14 ENCOUNTER — Other Ambulatory Visit: Payer: Self-pay | Admitting: Adult Health

## 2012-12-16 ENCOUNTER — Other Ambulatory Visit: Payer: Self-pay | Admitting: *Deleted

## 2012-12-16 ENCOUNTER — Telehealth: Payer: Self-pay | Admitting: Pulmonary Disease

## 2012-12-16 ENCOUNTER — Telehealth: Payer: Self-pay | Admitting: Internal Medicine

## 2012-12-16 MED ORDER — LOSARTAN POTASSIUM 50 MG PO TABS: ORAL_TABLET | ORAL | Status: DC

## 2012-12-16 NOTE — Telephone Encounter (Signed)
ERROR

## 2012-12-16 NOTE — Telephone Encounter (Signed)
I spoke with pt. Per last refill request 11/03/12 it states to get further refills from PCP. I called and made pt aware. Nothing further needed

## 2013-01-02 ENCOUNTER — Ambulatory Visit: Payer: Medicare Other | Admitting: Adult Health

## 2013-01-02 ENCOUNTER — Other Ambulatory Visit: Payer: Medicare Other

## 2013-01-03 ENCOUNTER — Ambulatory Visit (INDEPENDENT_AMBULATORY_CARE_PROVIDER_SITE_OTHER): Payer: Medicare Other | Admitting: Adult Health

## 2013-01-03 ENCOUNTER — Ambulatory Visit: Payer: Medicare Other | Admitting: Adult Health

## 2013-01-03 ENCOUNTER — Encounter: Payer: Self-pay | Admitting: Adult Health

## 2013-01-03 ENCOUNTER — Other Ambulatory Visit (INDEPENDENT_AMBULATORY_CARE_PROVIDER_SITE_OTHER): Payer: Medicare Other

## 2013-01-03 VITALS — BP 120/60 | HR 62 | Temp 98.0°F | Ht 67.0 in | Wt 184.6 lb

## 2013-01-03 DIAGNOSIS — J449 Chronic obstructive pulmonary disease, unspecified: Secondary | ICD-10-CM

## 2013-01-03 DIAGNOSIS — E785 Hyperlipidemia, unspecified: Secondary | ICD-10-CM

## 2013-01-03 LAB — LIPID PANEL
Total CHOL/HDL Ratio: 2
Triglycerides: 89 mg/dL (ref 0.0–149.0)

## 2013-01-03 NOTE — Patient Instructions (Signed)
Continue Ipratropium Neb Four times a day   Continue on Advair 1 puff Twice daily   follow up Dr. Vassie Loll  In 4  months and As needed   Please contact office for sooner follow up if symptoms do not improve or worsen or seek emergency care

## 2013-01-03 NOTE — Assessment & Plan Note (Addendum)
Compensated on present regimen. Good control on  Advair twice daily and Atrovent nebulizers 4 times daily. Flu shot is up-to-date  Plan  Continue Ipratropium Neb Four times a day   Continue on Advair 1 puff Twice daily   follow up Dr. Vassie Loll  In 4  months and As needed   Please contact office for sooner follow up if symptoms do not improve or worsen or seek emergency care

## 2013-01-03 NOTE — Progress Notes (Signed)
  Subjective:    Patient ID: [name], male    DOB: 11-04-1945, 67 y.o.   MRN: [id number]  HPI 67/M, ex smoker for FU of severe COPD - gold stg iV  46 PY smoker , quit 2008, when he had an MI  Can work in the yard x 20 mins, walk around the house or in the store  PFT showed FEv1 23% with good response to bronchodilator improving to 30%, air trapping & decreased diffusion c/w  Put on advair by [facility], taking once daily    07/05/2010 Acute OV - restart advair  07/12/2011 spiriva helped a little bit in the past - does not want to try it due to fixed income  He deferred Hernia repair - [facility]  Not interested in pulm rehab    05/24/2012 >ace stopped    09/30/12  07/2012 COPD flare , tx w/ Levaquin and steroid taper  5.19.14 ONO results from [facility]  Per TP: begin O2 at 2lpm at bedtime Concerned about his long cannula tubing as he tripped over this last night and would like recommendations  Pt sleeps in the living room with the concentrator near the bed (sofa-sleeper)  Pt will maneuver the tubing behind the side-table and couch to decrease his chances of tripping over it  Pt will call his DME company if needed to see if they can provide a shorter cannula  his lips have been puffy and swollen on and off x 1week  Puffiness and swelling of lips are not effecting his breathing  Occurs at no particular time  Patient concerned if this is related to the start of the nebulizers  Started cholesterol medication Pt states his breathing has been "great". He likes current regimen of alb nebs 4 times/d Did not see any improvement with o2- would like to stop Denies any wheezing, no chest tx, no wheezing. If he does he uses his rescue inhaler and it helps.    01/03/2013 Follow up COPD  3 month follow up for COPD . Says, overall he has been doing well.Marland Kitchen He remains on Advair twice daily along with Atrovent nebulizers 4 times daily. Patient walks most days at the [place]. He denies any chest  pain, orthopnea, PND, or leg swelling. He's had no hospitalizations or emergency room visits since last seen. He has rare albuterol use. Last chest x-ray, 08/01/2012 with no acute findings. He denies any unintentional weight loss, or hemoptysis.    Review of Systems   neg for any significant sore throat, dysphagia, itching, sneezing, nasal congestion or excess/ purulent secretions, fever, chills, sweats, unintended wt loss, pleuritic or exertional cp, hempoptysis, orthopnea pnd or change in chronic leg swelling. Also denies presyncope, palpitations, heartburn, abdominal pain, nausea, vomiting, diarrhea or change in bowel or urinary habits, dysuria,hematuria, rash, arthralgias, visual complaints, headache, numbness weakness or ataxia.     Objective:   Physical Exam   Gen. Pleasant, well-nourished, in no distress ENT - no lesions, no post nasal drip Neck: No JVD, no thyromegaly, no carotid bruits Lungs: no use of accessory muscles, no dullness to percussion, clear without rales or rhonchi  Cardiovascular: Rhythm regular, heart sounds  normal, no murmurs or gallops, no peripheral edema Musculoskeletal: No deformities, no cyanosis or clubbing         Assessment & Plan:

## 2013-01-06 ENCOUNTER — Encounter: Payer: Self-pay | Admitting: *Deleted

## 2013-01-20 ENCOUNTER — Encounter (INDEPENDENT_AMBULATORY_CARE_PROVIDER_SITE_OTHER): Payer: Medicare Other | Admitting: Surgery

## 2013-01-22 ENCOUNTER — Encounter (INDEPENDENT_AMBULATORY_CARE_PROVIDER_SITE_OTHER): Payer: Self-pay | Admitting: Surgery

## 2013-01-22 ENCOUNTER — Other Ambulatory Visit: Payer: Self-pay | Admitting: Internal Medicine

## 2013-01-22 ENCOUNTER — Ambulatory Visit (INDEPENDENT_AMBULATORY_CARE_PROVIDER_SITE_OTHER): Payer: Medicare Other | Admitting: Surgery

## 2013-01-22 VITALS — BP 122/60 | HR 68 | Temp 97.1°F | Resp 16 | Ht 67.0 in | Wt 184.6 lb

## 2013-01-22 DIAGNOSIS — R109 Unspecified abdominal pain: Secondary | ICD-10-CM

## 2013-01-22 DIAGNOSIS — K5909 Other constipation: Secondary | ICD-10-CM | POA: Insufficient documentation

## 2013-01-22 DIAGNOSIS — E669 Obesity, unspecified: Secondary | ICD-10-CM

## 2013-01-22 DIAGNOSIS — G8929 Other chronic pain: Secondary | ICD-10-CM | POA: Insufficient documentation

## 2013-01-22 DIAGNOSIS — K429 Umbilical hernia without obstruction or gangrene: Secondary | ICD-10-CM

## 2013-01-22 DIAGNOSIS — K5900 Constipation, unspecified: Secondary | ICD-10-CM

## 2013-01-22 NOTE — Progress Notes (Signed)
Subjective:     Patient ID: [name], male   DOB: January 26, 1946, 67 y.o.   MRN: [id number]  HPI  GRAM SIEDLECKI  [birth year] [id number]  Patient Care Team: Johny Blamer, MD as PCP - General (Family Medicine) Hoyle Sauer, MD as Consulting Physician (Pulmonary Disease) Pricilla Riffle, MD as Consulting Physician (Cardiology)  This patient is a 67 y.o.male who presents today for surgical evaluation at the request of self.   Reason for visit: Persistent umbilical hernia with abd pain  Obese male with COPD & CAD stable.  Intermittent abd pain, especially after eating, especially at bellybutton hernia.  Seen by [place] last year.  Umb hernia repair offered.  Cardiology cleared him.  Pulmonary a little more concerned but not prohibiting.  He delayed surgery due to personal issues.  Those are more stable now.  Notes postparadial bloating & abd pain.  Sounds like constipation.  Never had a colonoscopy.  Refuses one.  Wished to be seen by a new surgeon.  No personal nor family history of GI/colon cancer, inflammatory bowel disease, irritable bowel syndrome, allergy such as Celiac Sprue, dietary/dairy problems, colitis, ulcers nor gastritis.  No recent sick contacts/gastroenteritis.  No travel outside the country.  No changes in diet.   No exertional chest/neck/shoulder/arm pain.  Patient can walk 20 minutes for about without difficulty.   Patient Active Problem List   Diagnosis Date Noted  . Chronic abdominal pain  01/22/2013  . Constipation, chronic 01/22/2013  . COPD exacerbation 07/26/2012  . Umbilical hernia 09/21/2011  . MYOCARDIAL INFARCTION, HX OF 11/18/2008  . PREMATURE VENTRICULAR CONTRACTIONS, FREQUENT 11/18/2008  . COPD 11/18/2008  . ARTHRITIS 11/18/2008  . APPENDECTOMY, HX OF 11/18/2008  . PERCUTANEOUS TRANSLUMINAL CORONARY ANGIOPLASTY, HX OF 11/18/2008  . HYPERLIPIDEMIA-MIXED 03/04/2008  . HYPERTENSION, BENIGN 03/04/2008  . CAD, NATIVE VESSEL 03/04/2008    Past  Medical History  Diagnosis Date  . Arthropathy, unspecified, site unspecified   . Old myocardial infarction   . Other premature beats   . Chronic airway obstruction, not elsewhere classified   . Other and unspecified hyperlipidemia   . Essential hypertension, benign   . Coronary atherosclerosis of native coronary artery     Past Surgical History  Procedure Laterality Date  . Appendectomy    . Ptca      History   Social History  . Marital Status: Divorced    Spouse Name: N/A    Number of Children: N/A  . Years of Education: N/A   Occupational History  . 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 Smoking status: Former Smoker -- 2.00 packs/day for 40 years    Types: Cigarettes, Pipe, Cigars    Quit date: 03/27/2006  . Smokeless tobacco: Never Used  . Alcohol Use: No  . Drug Use: No  . Sexual Activity: Not on file   Other Topics Concern  . Not on file   Social History Narrative  . No narrative on file    Family History  Problem Relation Age of Onset  . Heart disease Mother 29    CABG    Current Outpatient Prescriptions  Medication Sig Dispense Refill  . albuterol (PROVENTIL HFA;VENTOLIN HFA) 108 (90 BASE) MCG/ACT inhaler Inhale 2 puffs into the lungs every 6 (six) hours as needed for wheezing (shortness of breath).  1 Inhaler  6  . amLODipine (NORVASC) 5 MG tablet TAKE 1 TABLET BY MOUTH  EVERY DAY  30 tablet  3  . aspirin 81 MG tablet Take 81 mg by mouth daily.        . Fluticasone-Salmeterol (ADVAIR) 250-50 MCG/DOSE AEPB 1 puff once  a day. Rinse mouth out well after each use      . ipratropium (ATROVENT) 0.02 % nebulizer solution Take 500 mcg by nebulization 4 (four) times daily.      Marland Kitchen losartan (COZAAR) 50 MG tablet TAKE 1 TABLET (50 MG TOTAL) BY MOUTH DAILY.  30 tablet  11  . metoprolol tartrate (LOPRESSOR) 25 MG tablet TAKE 1 TABLET (25 MG TOTAL) BY MOUTH 2 (TWO) TIMES DAILY.  60 tablet  6  . simvastatin (ZOCOR) 40 MG  tablet TAKE 1 TABLET (40 MG TOTAL) BY MOUTH AT BEDTIME.  30 tablet  10   No current facility-administered medications for this visit.     No Known Allergies  BP 122/60  Pulse 68  Temp(Src) 97.1 F (36.2 C) (Temporal)  Resp 16  Ht 5\' 7"  (1.702 m)  Wt 184 lb 9.6 oz (83.734 kg)  BMI 28.91 kg/m2  No results found.   Review of Systems  Constitutional: Negative for fever, chills and diaphoresis.  HENT: Negative for ear discharge, facial swelling, mouth sores, nosebleeds, sore throat and trouble swallowing.   Eyes: Negative for photophobia, discharge and visual disturbance.  Respiratory: Negative for cough, choking, chest tightness, wheezing and stridor.   Cardiovascular: Negative for chest pain and palpitations.  Gastrointestinal: Positive for abdominal pain and abdominal distention. Negative for nausea, vomiting, diarrhea, constipation, blood in stool, anal bleeding and rectal pain.  Endocrine: Negative for cold intolerance and heat intolerance.  Genitourinary: Negative for dysuria, urgency, difficulty urinating and testicular pain.  Musculoskeletal: Negative for arthralgias, back pain, gait problem and myalgias.  Skin: Negative for color change, pallor, rash and wound.  Allergic/Immunologic: Negative for environmental allergies and food allergies.  Neurological: Negative for dizziness, speech difficulty, weakness, numbness and headaches.  Hematological: Negative for adenopathy. Does not bruise/bleed easily.  Psychiatric/Behavioral: Negative for hallucinations, confusion and agitation.       Objective:   Physical Exam  Constitutional: He is oriented to person, place, and time. He appears well-developed and well-nourished. No distress.  HENT:  Head: Normocephalic.  Mouth/Throat: Oropharynx is clear and moist. No oropharyngeal exudate.  Eyes: Conjunctivae and EOM are normal. Pupils are equal, round, and reactive to light. No scleral icterus.  Neck: Normal range of motion. Neck  supple. No tracheal deviation present.  Cardiovascular: Normal rate, regular rhythm and intact distal pulses.   Pulmonary/Chest: Effort normal and breath sounds normal. No respiratory distress.  Abdominal: Soft. He exhibits no distension. There is generalized tenderness. There is no rigidity, no rebound, no guarding, no CVA tenderness, no tenderness at McBurney's point and negative Murphy's sign. A hernia is present. Hernia confirmed positive in the ventral area. Hernia confirmed negative in the right inguinal area and confirmed negative in the left inguinal area.    Musculoskeletal: Normal range of motion. He exhibits no tenderness.  Lymphadenopathy:    He has no cervical adenopathy.       Right: No inguinal adenopathy present.       Left: No inguinal adenopathy present.  Neurological: He is alert and oriented to person, place, and time. No cranial nerve deficit. He exhibits normal muscle tone. Coordination normal.  Skin: Skin is warm and dry. No rash noted. He is not diaphoretic. No erythema. No pallor.  Psychiatric: He has a normal mood and  affect. His speech is normal and behavior is normal. Judgment normal.  Needs numerous reexplanations to remember.  Pleasant       Assessment:     Symptomatic umbilical hernia in obese male with stable COPD & CAD    Plan:     I offered surgical repair.  I recommended lap approach w mesh given obesity & straining w COPD makes stitches only unlikely to be successful.  Other option is observation since not large.  He wishes to be aggressive.  I think if OK by pulmonary, OK to proceed.  Seen by cardiology & pulmonary this month w issues stable.  Pt has decent exercise tolerance w/o oxygen.    The anatomy & physiology of the abdominal wall was discussed.  The pathophysiology of hernias was discussed.  Natural history risks without surgery including progeressive enlargement, pain, incarceration & strangulation was discussed.   Contributors to complications  such as smoking, obesity, diabetes, prior surgery, etc were discussed.   I feel the risks of no intervention will lead to serious problems that outweigh the operative risks; therefore, I recommended surgery to reduce and repair the hernia.  I explained laparoscopic techniques with possible need for an open approach.  I noted the probable use of mesh to patch and/or buttress the hernia repair  Risks such as bleeding, infection, abscess, need for further treatment, heart attack, death, and other risks were discussed.  I noted a good likelihood this will help address the problem.   Goals of post-operative recovery were discussed as well.  Possibility that this will not correct all symptoms was explained.  I stressed the importance of low-impact activity, aggressive pain control, avoiding constipation, & not pushing through pain to minimize risk of post-operative chronic pain or injury. Possibility of reherniation especially with smoking, obesity, diabetes, immunosuppression, and other health conditions was discussed.  We will work to minimize complications.     An educational handout further explaining the pathology & treatment options was given as well.  Patient needed repeated explanations but seemed to get it in the end of the visit.  Questions were answered.  The patient expresses understanding & wishes to proceed with surgery.  He has my card.    I recommended colonoscopy.  He again refused.  I recommended fiber bowel regimen to control constipation.  He will try that.

## 2013-01-22 NOTE — Patient Instructions (Signed)
See the Handout(s) we gave you.  Consider surgery to repair hernia at belly button.  Please call our office at [phone] if you wish to schedule surgery or if you have further questions / concerns.   We need clearance from your Pulmonologist 1st.  Consider a colonoscopy to rule out polyps or cancer  Hernia A hernia occurs when an internal organ pushes out through a weak spot in the abdominal wall. Hernias most commonly occur in the groin and around the navel. Hernias often can be pushed back into place (reduced). Most hernias tend to get worse over time. Some abdominal hernias can get stuck in the opening (irreducible or incarcerated hernia) and cannot be reduced. An irreducible abdominal hernia which is tightly squeezed into the opening is at risk for impaired blood supply (strangulated hernia). A strangulated hernia is a medical emergency. Because of the risk for an irreducible or strangulated hernia, surgery may be recommended to repair a hernia. CAUSES   Heavy lifting.  Prolonged coughing.  Straining to have a bowel movement.  A cut (incision) made during an abdominal surgery. HOME CARE INSTRUCTIONS   Bed rest is not required. You may continue your normal activities.  Avoid lifting more than 10 pounds (4.5 kg) or straining.  Cough gently. If you are a smoker it is best to stop. Even the best hernia repair can break down with the continual strain of coughing. Even if you do not have your hernia repaired, a cough will continue to aggravate the problem.  Do not wear anything tight over your hernia. Do not try to keep it in with an outside bandage or truss. These can damage abdominal contents if they are trapped within the hernia sac.  Eat a normal diet.  Avoid constipation. Straining over long periods of time will increase hernia size and encourage breakdown of repairs. If you cannot do this with diet alone, stool softeners may be used. SEEK IMMEDIATE MEDICAL CARE IF:   You  have a fever.  You develop increasing abdominal pain.  You feel nauseous or vomit.  Your hernia is stuck outside the abdomen, looks discolored, feels hard, or is tender.  You have any changes in your bowel habits or in the hernia that are unusual for you.  You have increased pain or swelling around the hernia.  You cannot push the hernia back in place by applying gentle pressure while lying down. MAKE SURE YOU:   Understand these instructions.  Will watch your condition.  Will get help right away if you are not doing well or get worse. Document Released: 03/13/2005 Document Revised: 06/05/2011 Document Reviewed: 10/31/2007 [facility] Patient Information 2014 [place], [place].  GETTING TO GOOD BOWEL HEALTH. Irregular bowel habits such as constipation and diarrhea can lead to many problems over time.  Having one soft bowel movement a day is the most important way to prevent further problems.  The anorectal canal is designed to handle stretching and feces to safely manage our ability to get rid of solid waste (feces, poop, stool) out of our body.  BUT, hard constipated stools can act like ripping concrete bricks and diarrhea can be a burning fire to this very sensitive area of our body, causing inflamed hemorrhoids, anal fissures, increasing risk is perirectal abscesses, abdominal pain/bloating, an making irritable bowel worse.     The goal: ONE SOFT BOWEL MOVEMENT A DAY!  To have soft, regular bowel movements:    Drink at least 8 tall glasses of water a day.  Take plenty of fiber.  Fiber is the undigested part of plant food that passes into the colon, acting s "natures broom" to encourage bowel motility and movement.  Fiber can absorb and hold large amounts of water. This results in a larger, bulkier stool, which is soft and easier to pass. Work gradually over several weeks up to 6 servings a day of fiber (25g a day even more if needed) in the form of: o Vegetables -- Root (potatoes,  carrots, turnips), leafy green (lettuce, salad greens, celery, spinach), or cooked high residue (cabbage, broccoli, etc) o Fruit -- Fresh (unpeeled skin & pulp), Dried (prunes, apricots, cherries, etc ),  or stewed ( applesauce)  o Whole grain breads, pasta, etc (whole wheat)  o Bran cereals    Bulking Agents -- This type of water-retaining fiber generally is easily obtained each day by one of the following:  o Psyllium bran -- The psyllium plant is remarkable because its ground seeds can retain so much water. This product is available as Metamucil, Konsyl, Effersyllium, Per Diem Fiber, or the less expensive generic preparation in drug and health food stores. Although labeled a laxative, it really is not a laxative.  o Methylcellulose -- This is another fiber derived from wood which also retains water. It is available as Citrucel. o Polyethylene Glycol - and "artificial" fiber commonly called Miralax or Glycolax.  It is helpful for people with gassy or bloated feelings with regular fiber o Flax Seed - a less gassy fiber than psyllium   No reading or other relaxing activity while on the toilet. If bowel movements take longer than 5 minutes, you are too constipated   AVOID CONSTIPATION.  High fiber and water intake usually takes care of this.  Sometimes a laxative is needed to stimulate more frequent bowel movements, but    Laxatives are not a good long-term solution as it can wear the colon out. o Osmotics (Milk of Magnesia, Fleets phosphosoda, Magnesium citrate, MiraLax, GoLytely) are safer than  o Stimulants (Senokot, Castor Oil, Dulcolax, Ex Lax)    o Do not take laxatives for more than 7days in a row.    IF SEVERELY CONSTIPATED, try a Bowel Retraining Program: o Do not use laxatives.  o Eat a diet high in roughage, such as bran cereals and leafy vegetables.  o Drink six (6) ounces of prune or apricot juice each morning.  o Eat two (2) large servings of stewed fruit each day.  o Take one (1)  heaping tablespoon of a psyllium-based bulking agent twice a day. Use sugar-free sweetener when possible to avoid excessive calories.  o Eat a normal breakfast.  o Set aside 15 minutes after breakfast to sit on the toilet, but do not strain to have a bowel movement.  o If you do not have a bowel movement by the third day, use an enema and repeat the above steps.    Controlling diarrhea o Switch to liquids and simpler foods for a few days to avoid stressing your intestines further. o Avoid dairy products (especially milk & ice cream) for a short time.  The intestines often can lose the ability to digest lactose when stressed. o Avoid foods that cause gassiness or bloating.  Typical foods include beans and other legumes, cabbage, broccoli, and dairy foods.  Every person has some sensitivity to other foods, so listen to our body and avoid those foods that trigger problems for you. o Adding fiber (Citrucel, Metamucil, psyllium, Miralax) gradually can help  thicken stools by absorbing excess fluid and retrain the intestines to act more normally.  Slowly increase the dose over a few weeks.  Too much fiber too soon can backfire and cause cramping & bloating. o Probiotics (such as active yogurt, Align, etc) may help repopulate the intestines and colon with normal bacteria and calm down a sensitive digestive tract.  Most studies show it to be of mild help, though, and such products can be costly. o Medicines:   Bismuth subsalicylate (ex. Kayopectate, Pepto Bismol) every 30 minutes for up to 6 doses can help control diarrhea.  Avoid if pregnant.   Loperamide (Immodium) can slow down diarrhea.  Start with two tablets (4mg  total) first and then try one tablet every 6 hours.  Avoid if you are having fevers or severe pain.  If you are not better or start feeling worse, stop all medicines and call your doctor for advice o Call your doctor if you are getting worse or not better.  Sometimes further testing (cultures,  endoscopy, X-ray studies, bloodwork, etc) may be needed to help diagnose and treat the cause of the diarrhea.  HERNIA REPAIR: POST OP INSTRUCTIONS  1. DIET: Follow a light bland diet the first 24 hours after arrival home, such as soup, liquids, crackers, etc.  Be sure to include lots of fluids daily.  Avoid fast food or heavy meals as your are more likely to get nauseated.  Eat a low fat the next few days after surgery. 2. Take your usually prescribed home medications unless otherwise directed. 3. PAIN CONTROL: a. Pain is best controlled by a usual combination of three different methods TOGETHER: i. Ice/Heat ii. Over the counter pain medication iii. Prescription pain medication b. Most patients will experience some swelling and bruising around the hernia(s) such as the bellybutton, groins, or old incisions.  Ice packs or heating pads (30-60 minutes up to 6 times a day) will help. Use ice for the first few days to help decrease swelling and bruising, then switch to heat to help relax tight/sore spots and speed recovery.  Some people prefer to use ice alone, heat alone, alternating between ice & heat.  Experiment to what works for you.  Swelling and bruising can take several weeks to resolve.   c. It is helpful to take an over-the-counter pain medication regularly for the first few weeks.  Choose one of the following that works best for you: i. Naproxen (Aleve, etc)  Two 220mg  tabs twice a day ii. Ibuprofen (Advil, etc) Three 200mg  tabs four times a day (every meal & bedtime) iii. Acetaminophen (Tylenol, etc) 325-650mg  four times a day (every meal & bedtime) d. A  prescription for pain medication should be given to you upon discharge.  Take your pain medication as prescribed.  i. If you are having problems/concerns with the prescription medicine (does not control pain, nausea, vomiting, rash, itching, etc), please call us [phone] to see if we need to switch you to a different pain medicine that  will work better for you and/or control your side effect better. ii. If you need a refill on your pain medication, please contact your pharmacy.  They will contact our office to request authorization. Prescriptions will not be filled after 5 pm or on week-ends. 4. Avoid getting constipated.  Between the surgery and the pain medications, it is common to experience some constipation.  Increasing fluid intake and taking a fiber supplement (such as Metamucil, Citrucel, FiberCon, MiraLax, etc) 1-2 times a day  regularly will usually help prevent this problem from occurring.  A mild laxative (prune juice, Milk of Magnesia, MiraLax, etc) should be taken according to package directions if there are no bowel movements after 48 hours.   5. Wash / shower every day.  You may shower over the dressings as they are waterproof.   6. Remove your waterproof bandages 5 days after surgery.  You may leave the incision open to air.  You may replace a dressing/Band-Aid to cover the incision for comfort if you wish.  Continue to shower over incision(s) after the dressing is off.    7. ACTIVITIES as tolerated:   a. You may resume regular (light) daily activities beginning the next day-such as daily self-care, walking, climbing stairs-gradually increasing activities as tolerated.  If you can walk 30 minutes without difficulty, it is safe to try more intense activity such as jogging, treadmill, bicycling, low-impact aerobics, swimming, etc. b. Save the most intensive and strenuous activity for last such as sit-ups, heavy lifting, contact sports, etc  Refrain from any heavy lifting or straining until you are off narcotics for pain control.   c. DO NOT PUSH THROUGH PAIN.  Let pain be your guide: If it hurts to do something, don't do it.  Pain is your body warning you to avoid that activity for another week until the pain goes down. d. You may drive when you are no longer taking prescription pain medication, you can comfortably wear a  seatbelt, and you can safely maneuver your car and apply brakes. e. Bonita Quin may have sexual intercourse when it is comfortable.  8. FOLLOW UP in our office a. Please call [facility] at [phone] to set up an appointment to see your surgeon in the office for a follow-up appointment approximately 2-3 weeks after your surgery. b. Make sure that you call for this appointment the day you arrive home to insure a convenient appointment time. 9.  IF YOU HAVE DISABILITY OR FAMILY LEAVE FORMS, BRING THEM TO THE OFFICE FOR PROCESSING.  DO NOT GIVE THEM TO YOUR DOCTOR.  WHEN TO CALL us [phone]: 1. Poor pain control 2. Reactions / problems with new medications (rash/itching, nausea, etc)  3. Fever over 101.5 F (38.5 C) 4. Inability to urinate 5. Nausea and/or vomiting 6. Worsening swelling or bruising 7. Continued bleeding from incision. 8. Increased pain, redness, or drainage from the incision   The clinic staff is available to answer your questions during regular business hours (8:30am-5pm).  Please don't hesitate to call and ask to speak to one of our nurses for clinical concerns.   If you have a medical emergency, go to the nearest emergency room or call 911.  A surgeon from [facility] is always on call at the hospitals in [facility], [place] [address], [place], [place]  [address] ?  [address] [address], [place], [place]   [address] MAIN: [phone] ? TOLL FREE: [phone] ? FAX: [phone] [email or web address]  Chronic Obstructive Pulmonary Disease Chronic obstructive pulmonary disease (COPD) is a condition in which airflow from the lungs is restricted. The lungs can never return to normal, but there are measures you can take which will improve them and make you feel better. CAUSES   Smoking.  Exposure to secondhand smoke.  Breathing in irritants such as air pollution, dust, cigarette smoke, strong odors, aerosol sprays, or  paint fumes.  History of lung infections. SYMPTOMS   Deep, persistent (chronic) cough with  a large amount of thick mucus.  Wheezing.  Shortness of breath, especially with physical activity.  Feeling like you cannot get enough air.  Difficulty breathing.  Rapid breaths (tachypnea).  Gray or bluish discoloration (cyanosis) of the skin, especially in fingers, toes, or lips.  Fatigue.  Weight loss.  Swelling in legs, ankles, or feet.  Fast heartbeat (tachycardia).  Frequent lung infections.   Chest tightness. DIAGNOSIS  Initial diagnosis may be based on your history, symptoms, and physical examination. Additional tests for COPD may include:  Chest X-ray.  Computed tomography (CT) scan.  Lung (pulmonary) function tests.  Blood tests. TREATMENT  Treatment focuses on making you comfortable (supportive care). Your caregiver may prescribe medicines (inhaled or pills) to help improve your breathing. Additional treatment options may include oxygen therapy and pulmonary rehabilitation. Treatment should also include reducing your exposure to known irritants and following a plan to stop smoking. HOME CARE INSTRUCTIONS   Take all medicines, including antibiotic medicines, as directed by your caregiver.  Use inhaled medicines as directed by your caregiver.  Avoid medicines or cough syrups that dry up your airway (antihistamines) and slow down the elimination of secretions. This decreases respiratory capacity and may lead to infections.  If you smoke, stop smoking.  Avoid exposure to smoke, chemicals, and fumes that aggravate your breathing.  Avoid contact with individuals that have a contagious illness.  Avoid extreme temperature and humidity changes.  Use humidifiers at home and at your bedside if they do not make breathing difficult.  Drink enough water and fluids to keep your urine clear or pale yellow. This loosens secretions.  Eat healthy foods. Eating smaller, more  frequent meals and resting before meals may help you maintain your strength.  Ask your caregiver about the use of vitamins and mineral supplements.  Stay active. Exercise and physical activity will help maintain your ability to do things you want to do.  Balance activity with periods of rest.  Assume a position of comfort if you become short of breath.  Learn and use relaxation techniques.  Learn and use controlled breathing techniques as directed by your caregiver. Controlled breathing techniques include:  Pursed lip breathing. This breathing technique starts with breathing in (inhaling) through your nose for 1 second. Next, purse your lips as if you were going to whistle. Then breathe out (exhale) through the pursed lips for 2 seconds.  Diaphragmatic breathing. Start by putting one hand on your abdomen just above your waist. Inhale slowly through your nose. The hand on your abdomen should move out. Then exhale slowly through pursed lips. You should be able to feel the hand on your abdomen moving in as you exhale.  Learn and use controlled coughing to clear mucus from your lungs. Controlled coughing is a series of short, progressive coughs. The steps of controlled coughing are: 1. Lean your head slightly forward. 2. Breathe in deeply using diaphragmatic breathing. 3. Try to hold your breath for 3 seconds. 4. Keep your mouth slightly open while coughing twice. 5. Spit any mucus out into a tissue. 6. Rest and repeat the steps once or twice as needed.  Receive all protective vaccines your caregiver suggests, especially pneumococcal and influenza vaccines.  Learn to manage stress.  Schedule and attend all follow-up appointments as directed by your caregiver. It is important to keep all your appointments.  Participate in pulmonary rehabilitation as directed by your caregiver.  Use home oxygen as suggested. SEEK MEDICAL CARE IF:   You are coughing up  more mucus than usual.  There is  a change in the color or thickness of the mucus.  Breathing is more labored than usual.  Your breathing is faster than usual.  Your skin color is more cyanotic than usual.  You are running out of the medicine you take for your breathing.  You are anxious, apprehensive, or restless.  You have a fever. SEEK IMMEDIATE MEDICAL CARE IF:   You have a rapid heart rate.  You have shortness of breath while you are resting.  You have shortness of breath that prevents you from being able to talk.  You have shortness of breath that prevents you from performing your usual physical activities.  You have chest pain lasting longer than 5 minutes.  You have a seizure.  Your family or friends notice that you are agitated or confused. MAKE SURE YOU:   Understand these instructions.  Will watch your condition.  Will get help right away if you are not doing well or get worse. Document Released: 12/21/2004 Document Revised: 12/06/2011 Document Reviewed: 05/13/2010 [facility] Patient Information 2014 [place], [place].

## 2013-01-23 ENCOUNTER — Encounter (INDEPENDENT_AMBULATORY_CARE_PROVIDER_SITE_OTHER): Payer: Self-pay

## 2013-01-23 ENCOUNTER — Telehealth: Payer: Self-pay | Admitting: Pulmonary Disease

## 2013-01-23 NOTE — Telephone Encounter (Signed)
Pt wanted to let us know that his surgeons office will be contacting us to get surgical clearance for hernia surgery. I advised we will pass info on to Dr. Vassie Loll once this is received. Surgery has not been set yet. LAst OV 01-03-13.

## 2013-01-29 ENCOUNTER — Telehealth: Payer: Self-pay | Admitting: Pulmonary Disease

## 2013-01-29 NOTE — Telephone Encounter (Signed)
Spoke with the pt and he wanted to know if we have been contacted by his surgeons office yet for surgical clearance. I advised I do not see any phone call in his chart. I advised that they usually send a fax that needs to be completed. Pt states he will call them to see if they have done this yet. Carron Curie, CMA

## 2013-01-30 ENCOUNTER — Telehealth (INDEPENDENT_AMBULATORY_CARE_PROVIDER_SITE_OTHER): Payer: Self-pay

## 2013-01-30 ENCOUNTER — Telehealth: Payer: Self-pay | Admitting: *Deleted

## 2013-01-30 NOTE — Telephone Encounter (Signed)
Pt is needing surgical clearance for hernia repair. Surgery is not scheduled yet. It will be done at [facility] by Dr. Karie Soda. Under General Anesthesia. We can fax her this clearance note to [phone]. Please advise Dr. Vassie Loll thanks

## 2013-01-30 NOTE — Telephone Encounter (Signed)
Called pt to notify him that I called Dr Reginia Naas office to check on the status of pulmonary clearance but Dr Vassie Loll not in the office. I advised pt that they were sending a phone note to Dr Vassie Loll about the clearance and as soon as I hear back from Dr Vassie Loll I will contact the pt. The pt understands.

## 2013-01-30 NOTE — Telephone Encounter (Signed)
LM checking on the status of pulmonary clearance with Dr Vassie Loll. They have the pulmonary clearance request but Dr Vassie Loll not in the office till next week. Mindy will try to send him phone note request for pulmonary clearance.

## 2013-01-30 NOTE — Telephone Encounter (Signed)
He has severe COPD & is at moderate -high risk for pulmonary complications after abdominal surgery Prefer spinal anesthesia if at all possible Call us for post op care

## 2013-01-31 NOTE — Telephone Encounter (Signed)
Spinal anesthesia does not work in this situation.  She requires general anesthesia.  Not a giant hernia so should not be a long case.  I'm not really worried about the anesthesia.  A more worried about pain postoperatively.  We'll try and do our best to minimize postoperative soreness, but it is a painful case

## 2013-02-01 ENCOUNTER — Other Ambulatory Visit: Payer: Self-pay | Admitting: Internal Medicine

## 2013-02-03 ENCOUNTER — Encounter (INDEPENDENT_AMBULATORY_CARE_PROVIDER_SITE_OTHER): Payer: Medicare Other | Admitting: Surgery

## 2013-02-07 ENCOUNTER — Other Ambulatory Visit (INDEPENDENT_AMBULATORY_CARE_PROVIDER_SITE_OTHER): Payer: Self-pay | Admitting: Surgery

## 2013-02-07 NOTE — Telephone Encounter (Signed)
Orders done

## 2013-02-07 NOTE — Telephone Encounter (Signed)
Called pt to notify him that I have received the pulmonary clearance so we will get him scheduled for surgery. I will get surgical orders from Dr Michaell Cowing and turn them in on Monday.

## 2013-02-12 ENCOUNTER — Other Ambulatory Visit: Payer: Self-pay | Admitting: Adult Health

## 2013-02-12 ENCOUNTER — Other Ambulatory Visit: Payer: Self-pay | Admitting: Internal Medicine

## 2013-02-13 NOTE — Telephone Encounter (Signed)
Losartan 50 mg rx was sent by Dr. Charlott Rakes office on 12/16/12 #30 x 11. Called [facility], spoke with [place].  Was advised they did receive the losartan rx from Dr. Charlott Rakes office.  Nothing further is needed.

## 2013-03-04 ENCOUNTER — Encounter (HOSPITAL_COMMUNITY): Payer: Self-pay | Admitting: Pharmacy Technician

## 2013-03-06 ENCOUNTER — Encounter (HOSPITAL_COMMUNITY): Payer: Self-pay

## 2013-03-06 ENCOUNTER — Encounter (HOSPITAL_COMMUNITY)
Admission: RE | Admit: 2013-03-06 | Discharge: 2013-03-06 | Disposition: A | Discharge status: Home / Self Care | Payer: Medicare Other | Source: Ambulatory Visit | Attending: Surgery | Admitting: Surgery

## 2013-03-06 DIAGNOSIS — K429 Umbilical hernia without obstruction or gangrene: Secondary | ICD-10-CM | POA: Insufficient documentation

## 2013-03-06 DIAGNOSIS — Z01812 Encounter for preprocedural laboratory examination: Secondary | ICD-10-CM | POA: Insufficient documentation

## 2013-03-06 HISTORY — DX: Unspecified osteoarthritis, unspecified site: M19.90

## 2013-03-06 HISTORY — DX: Gastro-esophageal reflux disease without esophagitis: K21.9

## 2013-03-06 LAB — CBC
HCT: 45.3 % (ref 39.0–52.0)
Hemoglobin: 15.8 g/dL (ref 13.0–17.0)
MCH: 31 pg (ref 26.0–34.0)
MCHC: 34.9 g/dL (ref 30.0–36.0)
MCV: 89 fL (ref 78.0–100.0)
Platelets: 114 10*3/uL — ABNORMAL LOW (ref 150–400)
RBC: 5.09 MIL/uL (ref 4.22–5.81)

## 2013-03-06 LAB — BASIC METABOLIC PANEL
CO2: 27 mEq/L (ref 19–32)
Calcium: 9.9 mg/dL (ref 8.4–10.5)
Chloride: 108 mEq/L (ref 96–112)
Creatinine, Ser: 0.96 mg/dL (ref 0.50–1.35)
GFR calc Af Amer: >90 mL/min (ref >90)
Glucose, Bld: 108 mg/dL — ABNORMAL HIGH (ref 70–99)
Sodium: 142 mEq/L (ref 135–145)

## 2013-03-06 MED ORDER — CHLORHEXIDINE GLUCONATE 4 % EX LIQD
1.0000 "application " | Freq: Once | CUTANEOUS | Status: DC
  Filled 2013-03-06: qty 15

## 2013-03-06 NOTE — Pre-Procedure Instructions (Signed)
Note faxed in epic to Dr. Michaell Cowing - pt's cbc report in epic for review - platelets  [phone].

## 2013-03-06 NOTE — Pre-Procedure Instructions (Signed)
PT HAS CXR REPORT IN EPIC FROM 08/21/12. PT HAS EKG AND CARDIOLOGY OFFICE NOTE 12/05/12 IN EPIC FROM DR. P. ROSS. PULMONARY OFFICE NOTE WITH CLEARANCE FOR SURGERY IN EPIC FROM DR. Vassie Loll.

## 2013-03-06 NOTE — Patient Instructions (Signed)
YOUR SURGERY IS SCHEDULED AT [facility]  ON: Thursday 12/18  REPORT TO  SHORT STAY CENTER AT:   10:45 AM      PHONE # FOR SHORT STAY IS [phone]  DO NOT EAT OR DRINK ANYTHING AFTER MIDNIGHT THE NIGHT BEFORE YOUR SURGERY.  YOU MAY BRUSH YOUR TEETH, RINSE OUT YOUR MOUTH--BUT NO WATER, NO FOOD, NO CHEWING GUM, NO MINTS, NO CANDIES, NO CHEWING TOBACCO.  PLEASE TAKE THE FOLLOWING MEDICATIONS THE AM OF YOUR SURGERY WITH A FEW SIPS OF WATER:  AMLODIPINE (NORVASC), METOPROLOL. USE YOUR NEBULIZER.  USE BOTH OF YOUR INHALERS - THE ALBUTEROL AND THE ADVAIR.  IF YOU USE INHALERS--USE YOUR INHALERS THE AM OF YOUR SURGERY AND BRING INHALERS TO THE HOSPITAL.   IF YOU ARE DIABETIC:  DO NOT TAKE ANY DIABETIC MEDICATIONS THE AM OF YOUR SURGERY.  IF YOU TAKE INSULIN IN THE EVENINGS--PLEASE ONLY TAKE 1/2 NORMAL EVENING DOSE THE NIGHT BEFORE YOUR SURGERY.  NO INSULIN THE AM OF YOUR SURGERY. IF YOU HAVE SLEEP APNEA AND USE CPAP OR BIPAP--PLEASE BRING THE MASK AND THE TUBING.  DO NOT BRING YOUR MACHINE.  DO NOT BRING VALUABLES, MONEY, CREDIT CARDS.  DO NOT WEAR JEWELRY, MAKE-UP, NAIL POLISH AND NO METAL PINS OR CLIPS IN YOUR HAIR. CONTACT LENS, DENTURES / PARTIALS, GLASSES SHOULD NOT BE WORN TO SURGERY AND IN MOST CASES-HEARING AIDS WILL NEED TO BE REMOVED.  BRING YOUR GLASSES CASE, ANY EQUIPMENT NEEDED FOR YOUR CONTACT LENS. FOR PATIENTS ADMITTED TO THE HOSPITAL--CHECK OUT TIME THE DAY OF DISCHARGE IS 11:00 AM.  ALL INPATIENT ROOMS ARE PRIVATE - WITH BATHROOM, TELEPHONE, TELEVISION AND WIFI INTERNET.  IF YOU ARE BEING DISCHARGED THE SAME DAY OF YOUR SURGERY--YOU CAN NOT DRIVE YOURSELF HOME--AND SHOULD NOT GO HOME ALONE BY TAXI OR BUS.  NO DRIVING OR OPERATING MACHINERY, DO NOT MAKE LEGAL DECISIONS FOR 24 HOURS FOLLOWING ANESTHESIA / PAIN MEDICATIONS.  PLEASE MAKE ARRANGEMENTS FOR SOMEONE TO BE WITH YOU AT HOME THE FIRST 24 HOURS AFTER SURGERY. RESPONSIBLE DRIVER'S NAME / PHONE  PT'S DAUGHTER  [name]   [phone]                                                    FAILURE TO FOLLOW THESE INSTRUCTIONS MAY RESULT IN THE CANCELLATION OF YOUR SURGERY. PLEASE BE AWARE THAT YOU MAY NEED ADDITIONAL BLOOD DRAWN DAY OF YOUR SURGERY  PATIENT SIGNATURE_________________________________

## 2013-03-06 NOTE — Progress Notes (Signed)
Dr. Michaell Cowing - pt's preop cbc report is in epic for your review - platelets 114,000.

## 2013-03-10 ENCOUNTER — Telehealth (INDEPENDENT_AMBULATORY_CARE_PROVIDER_SITE_OTHER): Payer: Self-pay

## 2013-03-10 NOTE — Telephone Encounter (Signed)
Called to make sure [name] received the message that Dr Michaell Cowing responded back to today since he was not here last week to answer her message. [name] is not in the hospital today so I gave the message to [name] that the pt needs a cbc the day of surgery. [name] will take care of the order for the pt.

## 2013-03-10 NOTE — Progress Notes (Signed)
Alisha from office of Dr Michaell Cowing called and stated that Dr Michaell Cowing wanted CBC checked am of surgery.  CBC order placed in EPIC.

## 2013-03-13 ENCOUNTER — Observation Stay (HOSPITAL_COMMUNITY)
Admission: RE | Admit: 2013-03-13 | Discharge: 2013-03-14 | Disposition: A | Discharge status: Home / Self Care | Payer: Medicare Other | Source: Ambulatory Visit | Attending: Surgery | Admitting: Surgery

## 2013-03-13 ENCOUNTER — Encounter (HOSPITAL_COMMUNITY)
Admission: RE | Disposition: A | Discharge status: Home / Self Care | Payer: Self-pay | Source: Ambulatory Visit | Attending: Surgery

## 2013-03-13 ENCOUNTER — Encounter (HOSPITAL_COMMUNITY): Payer: Self-pay | Admitting: *Deleted

## 2013-03-13 ENCOUNTER — Ambulatory Visit (HOSPITAL_COMMUNITY): Payer: Medicare Other | Admitting: Registered Nurse

## 2013-03-13 ENCOUNTER — Encounter (HOSPITAL_COMMUNITY): Payer: Medicare Other | Admitting: Registered Nurse

## 2013-03-13 DIAGNOSIS — M6200 Separation of muscle (nontraumatic), unspecified site: Secondary | ICD-10-CM | POA: Insufficient documentation

## 2013-03-13 DIAGNOSIS — Z9861 Coronary angioplasty status: Secondary | ICD-10-CM | POA: Insufficient documentation

## 2013-03-13 DIAGNOSIS — J449 Chronic obstructive pulmonary disease, unspecified: Secondary | ICD-10-CM | POA: Insufficient documentation

## 2013-03-13 DIAGNOSIS — K439 Ventral hernia without obstruction or gangrene: Principal | ICD-10-CM | POA: Diagnosis present

## 2013-03-13 DIAGNOSIS — Z7982 Long term (current) use of aspirin: Secondary | ICD-10-CM | POA: Insufficient documentation

## 2013-03-13 DIAGNOSIS — K219 Gastro-esophageal reflux disease without esophagitis: Secondary | ICD-10-CM | POA: Insufficient documentation

## 2013-03-13 DIAGNOSIS — I10 Essential (primary) hypertension: Secondary | ICD-10-CM | POA: Insufficient documentation

## 2013-03-13 DIAGNOSIS — K429 Umbilical hernia without obstruction or gangrene: Secondary | ICD-10-CM | POA: Insufficient documentation

## 2013-03-13 DIAGNOSIS — I2510 Atherosclerotic heart disease of native coronary artery without angina pectoris: Secondary | ICD-10-CM | POA: Insufficient documentation

## 2013-03-13 DIAGNOSIS — J4489 Other specified chronic obstructive pulmonary disease: Secondary | ICD-10-CM | POA: Insufficient documentation

## 2013-03-13 HISTORY — PX: VENTRAL HERNIA REPAIR: SHX424

## 2013-03-13 HISTORY — PX: INSERTION OF MESH: SHX5868

## 2013-03-13 LAB — CBC
HCT: 42.3 % (ref 39.0–52.0)
MCH: 31.3 pg (ref 26.0–34.0)
MCV: 88.1 fL (ref 78.0–100.0)
Platelets: 103 10*3/uL — ABNORMAL LOW (ref 150–400)
RDW: 14.6 % (ref 11.5–15.5)
WBC: 7.5 10*3/uL (ref 4.0–10.5)

## 2013-03-13 SURGERY — REPAIR, HERNIA, VENTRAL, LAPAROSCOPIC
Anesthesia: General

## 2013-03-13 MED ORDER — ONDANSETRON HCL 4 MG/2ML IJ SOLN
INTRAMUSCULAR | Status: DC | PRN
  Administered 2013-03-13: 4 mg via INTRAVENOUS

## 2013-03-13 MED ORDER — SALINE SPRAY 0.65 % NA SOLN
1.0000 | NASAL | Status: DC | PRN
  Filled 2013-03-13: qty 44

## 2013-03-13 MED ORDER — ZOLPIDEM TARTRATE 5 MG PO TABS: 5.0000 mg | ORAL_TABLET | Freq: Every evening | ORAL | Status: DC | PRN

## 2013-03-13 MED ORDER — CEFAZOLIN SODIUM-DEXTROSE 2-3 GM-% IV SOLR
INTRAVENOUS | Status: AC
  Filled 2013-03-13: qty 50

## 2013-03-13 MED ORDER — DOCUSATE SODIUM 100 MG PO CAPS
100.0000 mg | ORAL_CAPSULE | Freq: Every day | ORAL | Status: DC
  Administered 2013-03-13: 100 mg via ORAL
  Filled 2013-03-13 (×2): qty 1

## 2013-03-13 MED ORDER — SODIUM CHLORIDE 0.9 % IJ SOLN: 3.0000 mL | Freq: Two times a day (BID) | INTRAMUSCULAR | Status: DC

## 2013-03-13 MED ORDER — ATORVASTATIN CALCIUM 20 MG PO TABS
20.0000 mg | ORAL_TABLET | Freq: Every day | ORAL | Status: DC
  Administered 2013-03-13: 20 mg via ORAL
  Filled 2013-03-13 (×2): qty 1

## 2013-03-13 MED ORDER — GUAIFENESIN ER 600 MG PO TB12
600.0000 mg | ORAL_TABLET | Freq: Two times a day (BID) | ORAL | Status: DC | PRN
  Filled 2013-03-13: qty 2

## 2013-03-13 MED ORDER — MAGNESIUM HYDROXIDE 400 MG/5ML PO SUSP: 30.0000 mL | Freq: Two times a day (BID) | ORAL | Status: DC | PRN

## 2013-03-13 MED ORDER — KETOROLAC TROMETHAMINE 30 MG/ML IJ SOLN
INTRAMUSCULAR | Status: DC | PRN
  Administered 2013-03-13: 15 mg via INTRAVENOUS

## 2013-03-13 MED ORDER — SUFENTANIL CITRATE 50 MCG/ML IV SOLN
INTRAVENOUS | Status: DC | PRN
  Administered 2013-03-13 (×2): 10 ug via INTRAVENOUS
  Administered 2013-03-13: 5 ug via INTRAVENOUS

## 2013-03-13 MED ORDER — PROMETHAZINE HCL 25 MG/ML IJ SOLN: 12.5000 mg | Freq: Four times a day (QID) | INTRAMUSCULAR | Status: DC | PRN

## 2013-03-13 MED ORDER — ACETAMINOPHEN 10 MG/ML IV SOLN
1000.0000 mg | Freq: Four times a day (QID) | INTRAVENOUS | Status: DC
  Administered 2013-03-13: 1000 mg via INTRAVENOUS
  Filled 2013-03-13: qty 100

## 2013-03-13 MED ORDER — PSYLLIUM 95 % PO PACK
1.0000 | PACK | Freq: Two times a day (BID) | ORAL | Status: DC
  Administered 2013-03-13: 1 via ORAL
  Filled 2013-03-13 (×3): qty 1

## 2013-03-13 MED ORDER — GLYCOPYRROLATE 0.2 MG/ML IJ SOLN
INTRAMUSCULAR | Status: DC | PRN
Start: 2013-03-13 — End: 2013-03-13
  Administered 2013-03-13: 0.4 mg via INTRAVENOUS

## 2013-03-13 MED ORDER — LACTATED RINGERS IV BOLUS (SEPSIS): 1000.0000 mL | Freq: Three times a day (TID) | INTRAVENOUS | Status: DC | PRN

## 2013-03-13 MED ORDER — LACTATED RINGERS IV SOLN
INTRAVENOUS | Status: DC | PRN
  Administered 2013-03-13 (×3): via INTRAVENOUS

## 2013-03-13 MED ORDER — HYDROMORPHONE HCL PF 1 MG/ML IJ SOLN
INTRAMUSCULAR | Status: AC
  Filled 2013-03-13: qty 1

## 2013-03-13 MED ORDER — MOMETASONE FURO-FORMOTEROL FUM 100-5 MCG/ACT IN AERO
2.0000 | INHALATION_SPRAY | Freq: Two times a day (BID) | RESPIRATORY_TRACT | Status: DC
  Administered 2013-03-13: 2 via RESPIRATORY_TRACT
  Filled 2013-03-13: qty 8.8

## 2013-03-13 MED ORDER — MIDAZOLAM HCL 2 MG/2ML IJ SOLN
INTRAMUSCULAR | Status: AC
  Filled 2013-03-13: qty 2

## 2013-03-13 MED ORDER — METOPROLOL TARTRATE 1 MG/ML IV SOLN
5.0000 mg | Freq: Four times a day (QID) | INTRAVENOUS | Status: DC | PRN
  Filled 2013-03-13: qty 5

## 2013-03-13 MED ORDER — LEVALBUTEROL HCL 1.25 MG/0.5ML IN NEBU
1.2500 mg | INHALATION_SOLUTION | Freq: Four times a day (QID) | RESPIRATORY_TRACT | Status: DC | PRN
  Filled 2013-03-13: qty 0.5

## 2013-03-13 MED ORDER — DEXAMETHASONE SODIUM PHOSPHATE 10 MG/ML IJ SOLN
INTRAMUSCULAR | Status: DC | PRN
  Administered 2013-03-13: 10 mg via INTRAVENOUS

## 2013-03-13 MED ORDER — IPRATROPIUM BROMIDE 0.02 % IN SOLN
500.0000 ug | Freq: Four times a day (QID) | RESPIRATORY_TRACT | Status: DC
  Administered 2013-03-13: 500 ug via RESPIRATORY_TRACT
  Filled 2013-03-13 (×2): qty 2.5

## 2013-03-13 MED ORDER — BUPIVACAINE-EPINEPHRINE PF 0.25-1:200000 % IJ SOLN
INTRAMUSCULAR | Status: AC
  Filled 2013-03-13: qty 30

## 2013-03-13 MED ORDER — LACTATED RINGERS IV SOLN: INTRAVENOUS | Status: DC | PRN

## 2013-03-13 MED ORDER — SUCCINYLCHOLINE CHLORIDE 20 MG/ML IJ SOLN
INTRAMUSCULAR | Status: AC
  Filled 2013-03-13: qty 1

## 2013-03-13 MED ORDER — LIDOCAINE HCL (CARDIAC) 20 MG/ML IV SOLN
INTRAVENOUS | Status: DC | PRN
  Administered 2013-03-13: 50 mg via INTRAVENOUS

## 2013-03-13 MED ORDER — MIDAZOLAM HCL 5 MG/5ML IJ SOLN
INTRAMUSCULAR | Status: DC | PRN
  Administered 2013-03-13: 2 mg via INTRAVENOUS

## 2013-03-13 MED ORDER — ACETAMINOPHEN 500 MG PO TABS
1000.0000 mg | ORAL_TABLET | Freq: Three times a day (TID) | ORAL | Status: DC
  Administered 2013-03-13 (×2): 1000 mg via ORAL
  Filled 2013-03-13 (×5): qty 2

## 2013-03-13 MED ORDER — METOPROLOL SUCCINATE ER 25 MG PO TB24
25.0000 mg | ORAL_TABLET | Freq: Two times a day (BID) | ORAL | Status: DC
  Administered 2013-03-13: 25 mg via ORAL
  Filled 2013-03-13 (×3): qty 1

## 2013-03-13 MED ORDER — ALBUTEROL SULFATE HFA 108 (90 BASE) MCG/ACT IN AERS
2.0000 | INHALATION_SPRAY | Freq: Four times a day (QID) | RESPIRATORY_TRACT | Status: DC | PRN
  Filled 2013-03-13: qty 6.7

## 2013-03-13 MED ORDER — HYDROMORPHONE HCL PF 1 MG/ML IJ SOLN
0.5000 mg | INTRAMUSCULAR | Status: DC | PRN
  Administered 2013-03-13: 1 mg via INTRAVENOUS
  Filled 2013-03-13: qty 1

## 2013-03-13 MED ORDER — BISACODYL 10 MG RE SUPP: 10.0000 mg | Freq: Two times a day (BID) | RECTAL | Status: DC | PRN

## 2013-03-13 MED ORDER — OXYCODONE HCL 10 MG PO TABS
5.0000 mg | ORAL_TABLET | ORAL | Status: DC | PRN
Start: 2013-03-13 — End: 2013-03-24

## 2013-03-13 MED ORDER — SUCCINYLCHOLINE CHLORIDE 20 MG/ML IJ SOLN
INTRAMUSCULAR | Status: DC | PRN
  Administered 2013-03-13: 100 mg via INTRAVENOUS

## 2013-03-13 MED ORDER — LOSARTAN POTASSIUM 50 MG PO TABS
50.0000 mg | ORAL_TABLET | Freq: Every day | ORAL | Status: DC
  Filled 2013-03-13: qty 1

## 2013-03-13 MED ORDER — ROCURONIUM BROMIDE 100 MG/10ML IV SOLN
INTRAVENOUS | Status: DC | PRN
  Administered 2013-03-13: 5 mg via INTRAVENOUS
  Administered 2013-03-13: 45 mg via INTRAVENOUS

## 2013-03-13 MED ORDER — PHENYLEPHRINE 40 MCG/ML (10ML) SYRINGE FOR IV PUSH (FOR BLOOD PRESSURE SUPPORT)
PREFILLED_SYRINGE | INTRAVENOUS | Status: AC
  Filled 2013-03-13: qty 20

## 2013-03-13 MED ORDER — OXYCODONE HCL 5 MG PO TABS: 5.0000 mg | ORAL_TABLET | ORAL | Status: DC | PRN

## 2013-03-13 MED ORDER — ROCURONIUM BROMIDE 100 MG/10ML IV SOLN
INTRAVENOUS | Status: AC
  Filled 2013-03-13: qty 1

## 2013-03-13 MED ORDER — GUAIFENESIN-DM 100-10 MG/5ML PO SYRP: 15.0000 mL | ORAL_SOLUTION | ORAL | Status: DC | PRN

## 2013-03-13 MED ORDER — BUPIVACAINE-EPINEPHRINE 0.25% -1:200000 IJ SOLN
INTRAMUSCULAR | Status: DC | PRN
  Administered 2013-03-13: 80 mL

## 2013-03-13 MED ORDER — PROMETHAZINE HCL 25 MG/ML IJ SOLN: 6.2500 mg | INTRAMUSCULAR | Status: DC | PRN

## 2013-03-13 MED ORDER — DEXAMETHASONE SODIUM PHOSPHATE 10 MG/ML IJ SOLN
INTRAMUSCULAR | Status: AC
  Filled 2013-03-13: qty 1

## 2013-03-13 MED ORDER — PROPOFOL 10 MG/ML IV BOLUS
INTRAVENOUS | Status: AC
  Filled 2013-03-13: qty 20

## 2013-03-13 MED ORDER — PHENYLEPHRINE HCL 10 MG/ML IJ SOLN
INTRAMUSCULAR | Status: DC | PRN
  Administered 2013-03-13 (×4): 120 ug via INTRAVENOUS

## 2013-03-13 MED ORDER — ALUM & MAG HYDROXIDE-SIMETH 200-200-20 MG/5ML PO SUSP: 30.0000 mL | Freq: Four times a day (QID) | ORAL | Status: DC | PRN

## 2013-03-13 MED ORDER — SUFENTANIL CITRATE 50 MCG/ML IV SOLN
INTRAVENOUS | Status: AC
  Filled 2013-03-13: qty 1

## 2013-03-13 MED ORDER — LIP MEDEX EX OINT
1.0000 "application " | TOPICAL_OINTMENT | Freq: Two times a day (BID) | CUTANEOUS | Status: DC
  Administered 2013-03-13: 1 via TOPICAL

## 2013-03-13 MED ORDER — MAGIC MOUTHWASH
15.0000 mL | Freq: Four times a day (QID) | ORAL | Status: DC | PRN
  Filled 2013-03-13: qty 15

## 2013-03-13 MED ORDER — SODIUM CHLORIDE 0.9 % IJ SOLN: 3.0000 mL | INTRAMUSCULAR | Status: DC | PRN

## 2013-03-13 MED ORDER — HYDROMORPHONE HCL PF 1 MG/ML IJ SOLN
0.2500 mg | INTRAMUSCULAR | Status: DC | PRN
  Administered 2013-03-13 (×2): 0.5 mg via INTRAVENOUS

## 2013-03-13 MED ORDER — AMLODIPINE BESYLATE 5 MG PO TABS
5.0000 mg | ORAL_TABLET | Freq: Every day | ORAL | Status: DC
  Filled 2013-03-13: qty 1

## 2013-03-13 MED ORDER — BUPIVACAINE 0.25 % ON-Q PUMP DUAL CATH 300 ML
300.0000 mL | INJECTION | Status: DC
Start: 2013-03-13 — End: 2013-03-14
  Filled 2013-03-13: qty 300

## 2013-03-13 MED ORDER — ASPIRIN 81 MG PO TABS: 81.0000 mg | ORAL_TABLET | Freq: Every day | ORAL | Status: DC

## 2013-03-13 MED ORDER — LIDOCAINE HCL (CARDIAC) 20 MG/ML IV SOLN
INTRAVENOUS | Status: AC
  Filled 2013-03-13: qty 5

## 2013-03-13 MED ORDER — ALBUTEROL SULFATE HFA 108 (90 BASE) MCG/ACT IN AERS
INHALATION_SPRAY | RESPIRATORY_TRACT | Status: AC
  Filled 2013-03-13: qty 6.7

## 2013-03-13 MED ORDER — PROPOFOL 10 MG/ML IV BOLUS
INTRAVENOUS | Status: DC | PRN
  Administered 2013-03-13: 170 mg via INTRAVENOUS

## 2013-03-13 MED ORDER — SIMVASTATIN 40 MG PO TABS: 40.0000 mg | ORAL_TABLET | Freq: Every day | ORAL | Status: DC

## 2013-03-13 MED ORDER — BUPIVACAINE-EPINEPHRINE 0.25% -1:200000 IJ SOLN
INTRAMUSCULAR | Status: AC
  Filled 2013-03-13: qty 1

## 2013-03-13 MED ORDER — CEFAZOLIN SODIUM-DEXTROSE 2-3 GM-% IV SOLR
2.0000 g | INTRAVENOUS | Status: AC
  Administered 2013-03-13: 2 g via INTRAVENOUS

## 2013-03-13 MED ORDER — BUPIVACAINE 0.25 % ON-Q PUMP DUAL CATH 300 ML
300.0000 mL | INJECTION | Status: DC
  Filled 2013-03-13: qty 300

## 2013-03-13 MED ORDER — ASPIRIN 81 MG PO CHEW
81.0000 mg | CHEWABLE_TABLET | Freq: Every day | ORAL | Status: DC
  Filled 2013-03-13: qty 1

## 2013-03-13 MED ORDER — ONDANSETRON HCL 4 MG/2ML IJ SOLN
INTRAMUSCULAR | Status: AC
  Filled 2013-03-13: qty 2

## 2013-03-13 MED ORDER — NEOSTIGMINE METHYLSULFATE 1 MG/ML IJ SOLN
INTRAMUSCULAR | Status: DC | PRN
  Administered 2013-03-13: 3 mg via INTRAVENOUS

## 2013-03-13 SURGICAL SUPPLY — 44 items
APPLIER CLIP 5 13 M/L LIGAMAX5 (MISCELLANEOUS)
BINDER ABD UNIV 12 45-62 (WOUND CARE) IMPLANT
BINDER ABDOMINAL 46IN 62IN (WOUND CARE)
BLADE HEX COATED 2.75 (ELECTRODE) IMPLANT
CABLE HI FREQUENCY MONOPOLAR (ELECTROSURGICAL) IMPLANT
CANISTER SUCTION 2500CC (MISCELLANEOUS) ×2 IMPLANT
CATH KIT ON Q 7.5IN SLV (PAIN MANAGEMENT) ×4 IMPLANT
CHLORAPREP W/TINT 26ML (MISCELLANEOUS) ×2 IMPLANT
CLIP APPLIE 5 13 M/L LIGAMAX5 (MISCELLANEOUS) IMPLANT
DECANTER SPIKE VIAL GLASS SM (MISCELLANEOUS) ×2 IMPLANT
DEVICE SECURE STRAP 25 ABSORB (INSTRUMENTS) ×2 IMPLANT
DEVICE TROCAR PUNCTURE CLOSURE (ENDOMECHANICALS) ×2 IMPLANT
DRAPE LAPAROSCOPIC ABDOMINAL (DRAPES) ×2 IMPLANT
DRAPE UTILITY XL STRL (DRAPES) ×2 IMPLANT
DRAPE WARM FLUID 44X44 (DRAPE) ×2 IMPLANT
DRSG TEGADERM 2-3/8X2-3/4 SM (GAUZE/BANDAGES/DRESSINGS) ×6 IMPLANT
ELECT REM PT RETURN 9FT ADLT (ELECTROSURGICAL) ×2
ELECTRODE REM PT RTRN 9FT ADLT (ELECTROSURGICAL) ×1 IMPLANT
GAUZE SPONGE 2X2 8PLY STRL LF (GAUZE/BANDAGES/DRESSINGS) IMPLANT
GLOVE ECLIPSE 8.0 STRL XLNG CF (GLOVE) ×2 IMPLANT
GLOVE INDICATOR 8.0 STRL GRN (GLOVE) ×2 IMPLANT
GOWN STRL REIN XL XLG (GOWN DISPOSABLE) ×4 IMPLANT
KIT BASIN OR (CUSTOM PROCEDURE TRAY) ×2 IMPLANT
MARKER SKIN DUAL TIP RULER LAB (MISCELLANEOUS) ×2 IMPLANT
MESH VENTRALIGHT ST 6X8 (Mesh Specialty) ×1 IMPLANT
MESH VENTRLGHT ELLIPSE 8X6XMFL (Mesh Specialty) ×1 IMPLANT
NEEDLE SPNL 22GX3.5 QUINCKE BK (NEEDLE) IMPLANT
PENCIL BUTTON HOLSTER BLD 10FT (ELECTRODE) IMPLANT
SCALPEL HARMONIC ACE (MISCELLANEOUS) IMPLANT
SCISSORS LAP 5X35 DISP (ENDOMECHANICALS) ×2 IMPLANT
SET IRRIG TUBING LAPAROSCOPIC (IRRIGATION / IRRIGATOR) IMPLANT
SLEEVE XCEL OPT CAN 5 100 (ENDOMECHANICALS) ×8 IMPLANT
SPONGE GAUZE 2X2 STER 10/PKG (GAUZE/BANDAGES/DRESSINGS)
STRIP CLOSURE SKIN 1/2X4 (GAUZE/BANDAGES/DRESSINGS) ×4 IMPLANT
SUT MNCRL AB 4-0 PS2 18 (SUTURE) ×2 IMPLANT
SUT PROLENE 1 CT 1 30 (SUTURE) ×18 IMPLANT
TOWEL OR 17X26 10 PK STRL BLUE (TOWEL DISPOSABLE) ×2 IMPLANT
TOWEL OR NON WOVEN STRL DISP B (DISPOSABLE) ×2 IMPLANT
TRAY FOLEY CATH 14FRSI W/METER (CATHETERS) IMPLANT
TRAY LAP CHOLE (CUSTOM PROCEDURE TRAY) ×2 IMPLANT
TROCAR BLADELESS OPT 5 100 (ENDOMECHANICALS) ×2 IMPLANT
TROCAR XCEL NON-BLD 11X100MML (ENDOMECHANICALS) IMPLANT
TUBING INSUFFLATION 10FT LAP (TUBING) IMPLANT
TUNNELER SHEATH ON-Q 16GX12 DP (PAIN MANAGEMENT) ×2 IMPLANT

## 2013-03-13 NOTE — Preoperative (Signed)
Beta Blockers   Reason not to administer Beta Blockers:Not Applicable, took BB this am 

## 2013-03-13 NOTE — Anesthesia Preprocedure Evaluation (Addendum)
Anesthesia Evaluation  Patient identified by MRN, date of birth, ID band Patient awake    Reviewed: Allergy & Precautions, H&P , NPO status , Patient's Chart, lab work & pertinent test results  Airway Mallampati: II TM Distance: >3 FB Neck ROM: Full    Dental no notable dental hx.    Pulmonary COPD COPD inhaler, former smoker,  FEV1 23% improved to 30% with bronchodilator. Pulmonary office visit of October 2014 reviewed. breath sounds clear to auscultation  Pulmonary exam normal       Cardiovascular hypertension, Pt. on medications and Pt. on home beta blockers + CAD and + Past MI + dysrhythmias Rhythm:Regular Rate:Normal  ECG: NSR, PACs CXR: hyperinflation.   Neuro/Psych negative neurological ROS  negative psych ROS   GI/Hepatic Neg liver ROS, GERD-  Medicated,  Endo/Other  negative endocrine ROS  Renal/GU negative Renal ROS  negative genitourinary   Musculoskeletal negative musculoskeletal ROS (+)   Abdominal   Peds negative pediatric ROS (+)  Hematology negative hematology ROS (+)   Anesthesia Other Findings   Reproductive/Obstetrics negative OB ROS                          Anesthesia Physical Anesthesia Plan  ASA: III  Anesthesia Plan: General   Post-op Pain Management:    Induction: Intravenous  Airway Management Planned: Oral ETT  Additional Equipment:   Intra-op Plan:   Post-operative Plan: Extubation in OR  Informed Consent: I have reviewed the patients History and Physical, chart, labs and discussed the procedure including the risks, benefits and alternatives for the proposed anesthesia with the patient or authorized representative who has indicated his/her understanding and acceptance.   Dental advisory given  Plan Discussed with: CRNA  Anesthesia Plan Comments:         Anesthesia Quick Evaluation

## 2013-03-13 NOTE — H&P (Signed)
[name] [name]  01-06-1946 [id number]  CARE TEAM:  PCP: Johny Blamer, MD  Outpatient Care Team: Patient Care Team: Johny Blamer, MD as PCP - General (Family Medicine) Hoyle Sauer, MD as Consulting Physician (Pulmonary Disease) Pricilla Riffle, MD as Consulting Physician (Cardiology)  Inpatient Treatment Team: Treatment Team: Attending Provider: Ardeth Sportsman, MD  This patient is a 67 y.o.male who presents today for surgical evaluation at the request of self.   Reason for visit: Persistent umbilical hernia with abd pain   Obese male with COPD & CAD stable. Intermittent abd pain, especially after eating, especially at bellybutton hernia. Seen by [place] last year. Umb hernia repair offered. Cardiology cleared him. Pulmonary a little more concerned but not prohibiting. He delayed surgery due to personal issues. Those are more stable now. Notes postparadial bloating & abd pain. Sounds like constipation. Never had a colonoscopy. Refuses one.   Wished to be seen by a new surgeon. No personal nor family history of GI/colon cancer, inflammatory bowel disease, irritable bowel syndrome, allergy such as Celiac Sprue, dietary/dairy problems, colitis, ulcers nor gastritis. No recent sick contacts/gastroenteritis. No travel outside the country. No changes in diet. No exertional chest/neck/shoulder/arm pain. Patient can walk 20 minutes for about without difficulty.      Past Medical History  Diagnosis Date  . Arthropathy, unspecified, site unspecified   . Old myocardial infarction   . Other premature beats   . Chronic airway obstruction, not elsewhere classified   . Other and unspecified hyperlipidemia   . Essential hypertension, benign   . Coronary atherosclerosis of native coronary artery     DR. PAULA ROSS IS PT'S CARDIOLOGIST  . GERD (gastroesophageal reflux disease)     PT STATES MINOR HEARTBURN -WOULD TAKE MAALOX OR OTHER OVER THE COUNTER MED  . Arthritis     IN HANDS     Past Surgical History  Procedure Laterality Date  . Appendectomy    . Ptca  2008  . Coronary angioplasty      History   Social History  . Marital Status: Divorced    Spouse Name: N/A    Number of Children: N/A  . Years of Education: N/A   Occupational History  . 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 Smoking status: Former Smoker -- 2.00 packs/day for 40 years    Types: Cigarettes, Pipe, Cigars    Quit date: 03/27/2006  . Smokeless tobacco: Never Used  . Alcohol Use: No  . Drug Use: No  . Sexual Activity: Not on file   Other Topics Concern  . Not on file   Social History Narrative  . No narrative on file    Family History  Problem Relation Age of Onset  . Heart disease Mother 7    CABG    Current Facility-Administered Medications  Medication Dose Route Frequency Provider Last Rate Last Dose  . ceFAZolin (ANCEF) IVPB 2 g/50 mL premix  2 g Intravenous 60 min Pre-Op Ardeth Sportsman, MD         No Known Allergies  ROS: Constitutional:  No fevers, chills, sweats.  Weight stable Eyes:  No vision changes, No discharge HENT:  No sore throats, nasal drainage Lymph: No neck swelling, No bruising easily Pulmonary:  No cough, productive sputum CV: No orthopnea, PND  Patient walks 20 minutes for about 1/2 miles without difficulty.  No exertional chest/neck/shoulder/arm pain. GI: No personal nor  family history of GI/colon cancer, inflammatory bowel disease, irritable bowel syndrome, allergy such as Celiac Sprue, dietary/dairy problems, colitis, ulcers nor gastritis.  No recent sick contacts/gastroenteritis.  No travel outside the country.  No changes in diet. Renal: No UTIs, No hematuria Genital:  No drainage, bleeding, masses Musculoskeletal: No severe joint pain.  Good ROM major joints Skin:  No sores or lesions.  No rashes Heme/Lymph:  No easy bleeding.  No swollen lymph nodes Neuro: No focal weakness/numbness.  No  seizures Psych: No suicidal ideation.  No hallucinations  BP 149/54  Pulse 79  Temp(Src) 97.8 F (36.6 C) (Oral)  Resp 18  SpO2 98%  Physical Exam: General: Pt awake/alert/oriented x4 in no major acute distress Eyes: PERRL, normal EOM. Sclera nonicteric Neuro: CN II-XII intact w/o focal sensory/motor deficits. Lymph: No head/neck/groin lymphadenopathy Psych:  No delerium/psychosis/paranoia HENT: Normocephalic, Mucus membranes moist.  No thrush Neck: Supple, No tracheal deviation Chest: No pain.  Good respiratory excursion. CV:  Pulses intact.  Regular rhythm Abdomen: Soft, Nondistended.  Min tender 2 cm hernia.  No incarcerated hernias. Ext:  SCDs BLE.  No significant edema.  No cyanosis Skin: No petechiae / purpurea.  No major sores Musculoskeletal: No severe joint pain.  Good ROM major joints   Results:   Labs: Results for orders placed during the hospital encounter of 03/13/13 (from the past 48 hour(s))  CBC     Status: Abnormal   Collection Time    03/13/13 11:00 AM      Result Value Range   WBC 7.5  4.0 - 10.5 K/uL   RBC 4.80  4.22 - 5.81 MIL/uL   Hemoglobin 15.0  13.0 - 17.0 g/dL   HCT 56.2  13.0 - 86.5 %   MCV 88.1  78.0 - 100.0 fL   MCH 31.3  26.0 - 34.0 pg   MCHC 35.5  30.0 - 36.0 g/dL   RDW 78.4  69.6 - 29.5 %   Platelets 103 (*) 150 - 400 K/uL   Comment: SPECIMEN CHECKED FOR CLOTS     REPEATED TO VERIFY     CONSISTENT WITH PREVIOUS RESULT    Imaging / Studies: No results found.  Medications / Allergies: per chart  Antibiotics: Anti-infectives   Start     Dose/Rate Route Frequency Ordered Stop   03/13/13 1032  ceFAZolin (ANCEF) IVPB 2 g/50 mL premix     2 g 100 mL/hr over 30 Minutes Intravenous 60 min pre-op 03/13/13 1032        Assessment  [name]  67 y.o. male  Day of Surgery  Procedure(s): LAPAROSCOPIC VENTRAL WALL  HERNIA INSERTION OF MESH  Problem List:  Principal Problem:   Umbilical hernia   Symptomatic umbilical  hernia in obese male with stable COPD & CAD  Plan:  Lap repair of hernia:  The anatomy & physiology of the abdominal wall was discussed.  The pathophysiology of hernias was discussed.  Natural history risks without surgery including progeressive enlargement, pain, incarceration & strangulation was discussed.   Contributors to complications such as smoking, obesity, diabetes, prior surgery, etc were discussed.   I feel the risks of no intervention will lead to serious problems that outweigh the operative risks; therefore, I recommended surgery to reduce and repair the hernia.  I explained laparoscopic techniques with possible need for an open approach.  I noted the probable use of mesh to patch and/or buttress the hernia repair  Risks such as bleeding, infection, abscess, need for further  treatment, heart attack, death, and other risks were discussed.  I noted a good likelihood this will help address the problem.   Goals of post-operative recovery were discussed as well.  Possibility that this will not correct all symptoms was explained.  I stressed the importance of low-impact activity, aggressive pain control, avoiding constipation, & not pushing through pain to minimize risk of post-operative chronic pain or injury. Possibility of reherniation especially with smoking, obesity, diabetes, immunosuppression, and other health conditions was discussed.  We will work to minimize complications.     An educational handout further explaining the pathology & treatment options was given as well.  Questions were answered.  The patient expresses understanding & wishes to proceed with surgery.   -VTE prophylaxis- SCDs, etc  -mobilize as tolerated to help recovery    Ardeth Sportsman, M.D., F.A.C.S. Gastrointestinal and Minimally Invasive Surgery [facility] [address], [address] [place], [place] [address] [phone] Main / Paging   03/13/2013

## 2013-03-13 NOTE — Progress Notes (Signed)
Pt was due to void at 2200 Pt only voided 50 cc. Bladder scanned pt with results of 380 cc. MD on call made aware. Orders received to give the pt 2 more hrs to void. If pt hasn't voided in 2 hrs to do an I/O cath.

## 2013-03-13 NOTE — Transfer of Care (Signed)
Immediate Anesthesia Transfer of Care Note  Patient: [name]  Procedure(s) Performed: Procedure(s): LAPAROSCOPIC VENTRAL WALL  HERNIA (N/A) INSERTION OF MESH (N/A)  Patient Location: PACU  Anesthesia Type:General  Level of Consciousness: awake, alert , oriented and patient cooperative  Airway & Oxygen Therapy: Patient Spontanous Breathing and Patient connected to face mask oxygen  Post-op Assessment: Report given to PACU RN, Post -op Vital signs reviewed and stable and Patient moving all extremities X 4  Post vital signs: stable  Complications: No apparent anesthesia complications

## 2013-03-13 NOTE — Anesthesia Postprocedure Evaluation (Signed)
  Anesthesia Post-op Note  Patient: [name]  Procedure(s) Performed: Procedure(s) (LRB): LAPAROSCOPIC VENTRAL WALL  HERNIA (N/A) INSERTION OF MESH (N/A)  Patient Location: PACU  Anesthesia Type: General  Level of Consciousness: awake and alert   Airway and Oxygen Therapy: Patient Spontanous Breathing  Post-op Pain: mild  Post-op Assessment: Post-op Vital signs reviewed, Patient's Cardiovascular Status Stable, Respiratory Function Stable, Patent Airway and No signs of Nausea or vomiting  Last Vitals:  Filed Vitals:   03/13/13 1805  BP: 140/61  Pulse: 89  Temp: 36 C  Resp: 12    Post-op Vital Signs: stable   Complications: No apparent anesthesia complications

## 2013-03-13 NOTE — Op Note (Signed)
03/13/2013  2:44 PM  PATIENT:  [name]  67 y.o. male  Patient Care Team: Johny Blamer, MD as PCP - General (Family Medicine) Hoyle Sauer, MD as Consulting Physician (Pulmonary Disease) Pricilla Riffle, MD as Consulting Physician (Cardiology)  PRE-OPERATIVE DIAGNOSIS:  periumbilical abdominal wall hernias  POST-OPERATIVE DIAGNOSIS:  PERIUMBILICAL ABDOMINAL WALL HERNIA with diastasis recti  PROCEDURE:  Procedure(s): LAPAROSCOPIC VENTRAL WALL  HERNIA REPAIR WITH INSERTION OF MESH  SURGEON:  Surgeon(s): Ardeth Sportsman, MD  ASSISTANT: RN   ANESTHESIA:   local and general  EBL:  Total I/O In: 1000 [I.V.:1000] Out: 150 [Urine:100; Blood:50]  Delay start of Pharmacological VTE agent (>24hrs) due to surgical blood loss or risk of bleeding:  no  DRAINS: none   SPECIMEN:  No Specimen  DISPOSITION OF SPECIMEN:  N/A  COUNTS:  YES  PLAN OF CARE: Admit for overnight observation  PATIENT DISPOSITION:  PACU - hemodynamically stable.  INDICATION: Pleasant patient has developed a ventral wall abdominal hernia.   Recommendation was made for surgical repair:  The anatomy & physiology of the abdominal wall was discussed. The pathophysiology of hernias was discussed. Natural history risks without surgery including progeressive enlargement, pain, incarceration & strangulation was discussed. Contributors to complications such as smoking, obesity, diabetes, prior surgery, etc were discussed.  I feel the risks of no intervention will lead to serious problems that outweigh the operative risks; therefore, I recommended surgery to reduce and repair the hernia. I explained laparoscopic techniques with possible need for an open approach. I noted the probable use of mesh to patch and/or buttress the hernia repair  Risks such as bleeding, infection, abscess, need for further treatment, heart attack, death, and other risks were discussed. I noted a good likelihood this will help address  the problem. Goals of post-operative recovery were discussed as well. Possibility that this will not correct all symptoms was explained. I stressed the importance of low-impact activity, aggressive pain control, avoiding constipation, & not pushing through pain to minimize risk of post-operative chronic pain or injury. Possibility of reherniation especially with smoking, obesity, diabetes, immunosuppression, and other health conditions was discussed. We will work to minimize complications.  An educational handout further explaining the pathology & treatment options was given as well. Questions were answered. The patient expresses understanding & wishes to proceed with surgery.   OR FINDINGS:  Patient had central region with some small Swiss cheese hernias in the setting of diastases recti.   Largest periumbilical.  Smaller hernia infraumbilical.  7 x 6 cm region.   Type of repair - Laparoscopic underlay repair   Name of mesh - Bard Ventralight dual sided (polypropylene / Seprafilm)  Size of mesh - Length 20 cm, Width 15 cm  Mesh overlap - 5-7 cm  Placement of mesh - Intraperitoneal underlay repair   DESCRIPTION:   Informed consent was confirmed. The patient underwent general anaesthesia without difficulty. The patient was positioned appropriately. VTE prevention in place. The patient's abdomen was clipped, prepped, & draped in a sterile fashion. Surgical timeout confirmed our plan.  The patient was positioned in reverse Trendelenburg. Abdominal entry was gained using optical entry technique in the left upper abdomen. Entry was clean. I induced carbon dioxide insufflation. Camera inspection revealed no injury. Extra ports were carefully placed under direct laparoscopic visualization.   I could see the hernias in the central abdomen.  I did laparoscopic lysis of adhesions to Take down the falciform ligament up closer to the liver to expose  the entire anterior abdominal wall.  I primarily used and  focused cold scissors.    I made sure hemostasis was good.  No hernias supraumbilically.  However, smaller hernia infraumbilically.  Obvious diastases and fascial weakness periumbilically.   I mapped out the region using a needle passer.   To ensure that I would have at least 5 cm radial coverage outside of the hernia defect, I chose a 20x15 cm dual sided mesh.  I placed #1 Prolene stitches around its edge about every 5 cm = 14 total.  I rolled the mesh & placed into the peritoneal cavity through the 10 cm fascial defect.  I unrolled  the mesh and positioned it appropriately.  I secured the mesh to cover up the hernia defect using a laparoscopic suture passer to pass the tails of the Prolene through the abdominal wall & tagged them with clamps.  I started out in four corners to make sure I had the mesh centered over the hernia defect appropriately, and then proceeded to work in quadrants.  We evacuated CO2 & desufflated the abdomen.  I tied the fascial stitches down.  I reinsufflated the abdomen.  The mesh provided at least 5-10 cm circumferential coverage around the entire region of hernia defects.   I tacked the edges & central part of the mesh to the peritoneum/posterior rectus fascia with SecureStrap absorbable tacks.   Hemostasis was excellent.  I closed the fascia port site on the 10 mm port using a 0 Vicryl stitch using laparoscopic intracorporeal suture passer. I then placed On-Q catheter sheaths in the preperitoneal plane under direct laparoscopic guidance from the subxiphoid region down to the posterior iliac crests in the lower flanks. I did reinspection. Hemostasis was good. Mesh laid well. Capnoperitoneum was evacuated. Ports were removed. The skin was closed with Monocryl at the port sites and Steri-Strips on the fascial stitch puncture sites. OnQ catheters were placed and the sheathes peeled away. On-Q pump was secured. Patient is being extubated to go to the recovery room. I'm about discussed  operative findings with the patient's family.

## 2013-03-14 ENCOUNTER — Encounter (HOSPITAL_COMMUNITY): Payer: Self-pay | Admitting: Surgery

## 2013-03-14 NOTE — Progress Notes (Signed)
Patient d/c'ed to home with discharge instructions given with grandson present. Ambulatory with abd binder on. Pain/discomfort denied. Tolerated breakfast and voiding on own. Was dressed and voiced readiness to go home. To d/c on-q pump at home with instructions to d/c re-inforced and marked in discharge instructions. To also d/c dressings on Sunday (pod #3).

## 2013-03-14 NOTE — Care Management Note (Signed)
    Page 1 of 1   03/14/2013     1:04:12 PM   CARE MANAGEMENT NOTE 03/14/2013  Patient:  [name], [name]   Account Number:  [id number]  Date Initiated:  03/14/2013  Documentation initiated by:  Lanier Clam  Subjective/Objective Assessment:   VENTRAL HERNIA     Action/Plan:   FROM HOME   Anticipated DC Date:  03/14/2013   Anticipated DC Plan:  HOME/SELF CARE      DC Planning Services  CM consult      Choice offered to / List presented to:             Status of service:  Completed, signed off Medicare Important Message given?   (If response is "NO", the following Medicare IM given date fields will be blank) Date Medicare IM given:   Date Additional Medicare IM given:    Discharge Disposition:  HOME/SELF CARE  Per UR Regulation:  Reviewed for med. necessity/level of care/duration of stay  If discussed at Long Length of Stay Meetings, dates discussed:    Comments:

## 2013-03-14 NOTE — Discharge Summary (Signed)
Physician Discharge Summary  Patient ID: [name] MRN: [id number] DOB/AGE: 1945/06/27 67 y.o.  Admit date: 03/13/2013 Discharge date: 03/14/2013  Patient Care Team: Johny Blamer, MD as PCP - General (Family Medicine) Hoyle Sauer, MD as Consulting Physician (Pulmonary Disease) Pricilla Riffle, MD as Consulting Physician (Cardiology)   Admission Diagnoses:  Discharge Diagnoses:  Principal Problem:   Umbilical hernia Active Problems:   Status post laparoscopic hernia repair   Discharged Condition: good  Hospital Course: Pt w sensitive periumbilical VWH requesting repair.  Pulmonary clearance done.  Underwent lap repair w mesh.  Postoperatively, the patient mobilized in the hallways and advanced to a solid diet gradually.  Pain was well-controlled and transitioned off IV medications.    By the time of discharge, the patient was walking well the hallways, eating food well, having flatus.  Pain was-controlled on an oral regimen.  Based on meeting DC criteria and recovering well, I felt it was safe for the patient to be discharged home with close followup.  Instructions were discussed in detail.  They are written as well.     Consults:   Significant Diagnostic Studies:   Results for orders placed during the hospital encounter of 03/13/13 (from the past 72 hour(s))  CBC     Status: Abnormal   Collection Time    03/13/13 11:00 AM      Result Value Range   WBC 7.5  4.0 - 10.5 K/uL   RBC 4.80  4.22 - 5.81 MIL/uL   Hemoglobin 15.0  13.0 - 17.0 g/dL   HCT 40.9  81.1 - 91.4 %   MCV 88.1  78.0 - 100.0 fL   MCH 31.3  26.0 - 34.0 pg   MCHC 35.5  30.0 - 36.0 g/dL   RDW 78.2  95.6 - 21.3 %   Platelets 103 (*) 150 - 400 K/uL   Comment: SPECIMEN CHECKED FOR CLOTS     REPEATED TO VERIFY     CONSISTENT WITH PREVIOUS RESULT     Treatments:   POST-OPERATIVE DIAGNOSIS: PERIUMBILICAL ABDOMINAL WALL HERNIA with diastasis recti   PROCEDURE: Procedure(s):  LAPAROSCOPIC  VENTRAL WALL HERNIA REPAIR WITH INSERTION OF MESH  SURGEON: Surgeon(s):  Ardeth Sportsman, MD      Discharge Exam: Blood pressure 159/74, pulse 93, temperature 97.5 F (36.4 C), temperature source Oral, resp. rate 16, height 5\' 7"  (1.702 m), weight 186 lb 12.8 oz (84.732 kg), SpO2 95.00%.  General: Pt awake/alert/oriented x4 in no major acute distress.  Walking in room in NAD.  CNA & son in room. Eyes: PERRL, normal EOM. Sclera nonicteric Neuro: CN II-XII intact w/o focal sensory/motor deficits. Lymph: No head/neck/groin lymphadenopathy Psych:  No delerium/psychosis/paranoia.  Smiling, inquisitive HENT: Normocephalic, Mucus membranes moist.  No thrush Neck: Supple, No tracheal deviation Chest: No pain.  Good respiratory excursion. CV:  Pulses intact.  Regular rhythm MS: Normal AROM mjr joints.  No obvious deformity Abdomen: Soft, obese.  Nondistended.  Min tender.  No incarcerated hernias. Ext:  SCDs BLE.  No significant edema.  No cyanosis Skin: No petechiae / purpura   Disposition:   Discharge Orders   Future Appointments Provider Department Dept Phone   03/24/2013 10:45 AM Ardeth Sportsman, MD [facility], [place] [phone]   Future Orders Complete By Expires   Call MD for:  extreme fatigue  As directed    Call MD for:  extreme fatigue  As directed    Call MD for:  hives  As directed  Call MD for:  hives  As directed    Call MD for:  persistant nausea and vomiting  As directed    Call MD for:  persistant nausea and vomiting  As directed    Call MD for:  redness, tenderness, or signs of infection (pain, swelling, redness, odor or green/yellow discharge around incision site)  As directed    Call MD for:  redness, tenderness, or signs of infection (pain, swelling, redness, odor or green/yellow discharge around incision site)  As directed    Call MD for:  severe uncontrolled pain  As directed    Call MD for:  severe uncontrolled pain  As directed    Call MD for:   As directed    Comments:     Temperature > 101.28F   Call MD for:  As directed    Comments:     Temperature > 101.28F   Diet - low sodium heart healthy  As directed    Diet - low sodium heart healthy  As directed    Discharge instructions  As directed    Comments:     Please see discharge instruction sheets.  Also refer to handout given an office.  Please call our office if you have any questions or concerns [phone]   Discharge instructions  As directed    Comments:     Please see discharge instruction sheets.  Also refer to handout given an office.  Please call our office if you have any questions or concerns [phone]   Discharge wound care:  As directed    Comments:     If you have closed incisions, shower and bathe over these incisions with soap and water every day.  Remove all surgical dressings on postoperative day #3.  You do not need to replace dressings over the closed incisions unless you feel more comfortable with a Band-Aid covering it.   If you have an open wound that requires packing, please see wound care instructions.  In general, remove all dressings, wash wound with soap and water and then replace with saline moistened gauze.  Do the dressing change at least every day.  Please call our office [phone] if you have further questions.   Discharge wound care:  As directed    Comments:     If you have closed incisions, shower and bathe over these incisions with soap and water every day.  Remove all surgical dressings on postoperative day #3.  You do not need to replace dressings over the closed incisions unless you feel more comfortable with a Band-Aid covering it.   If you have an open wound that requires packing, please see wound care instructions.  In general, remove all dressings, wash wound with soap and water and then replace with saline moistened gauze.  Do the dressing change at least every day.  Please call our office [phone] if you have further  questions.   Driving Restrictions  As directed    Comments:     No driving until off narcotics and can safely swerve away without pain during an emergency   Driving Restrictions  As directed    Comments:     No driving until off narcotics and can safely swerve away without pain during an emergency   Increase activity slowly  As directed    Comments:     Walk an hour a day.  Use 20-30 minute walks.  When you can walk 30 minutes without difficulty, increase to low impact/moderate  activities such as biking, jogging, swimming, sexual activity..  Eventually can increase to unrestricted activity when not feeling pain.  If you feel pain: STOP!Marland Kitchen   Let pain protect you from overdoing it.  Use ice/heat/over-the-counter pain medications to help minimize his soreness.  Use pain prescriptions as needed to remain active.  It is better to take extra pain medications and be more active than to stay bedridden to avoid all pain medications.   Increase activity slowly  As directed    Comments:     Walk an hour a day.  Use 20-30 minute walks.  When you can walk 30 minutes without difficulty, increase to low impact/moderate activities such as biking, jogging, swimming, sexual activity..  Eventually can increase to unrestricted activity when not feeling pain.  If you feel pain: STOP!Marland Kitchen   Let pain protect you from overdoing it.  Use ice/heat/over-the-counter pain medications to help minimize his soreness.  Use pain prescriptions as needed to remain active.  It is better to take extra pain medications and be more active than to stay bedridden to avoid all pain medications.   Lifting restrictions  As directed    Comments:     Avoid heavy lifting initially.  Do not push through pain.  You have no specific weight limit.  Coughing and sneezing or four more stressful to your incision than any lifting you will do. Pain will protect you from injury.  Therefore, avoid intense activity until off all narcotic pain medications.   Coughing and sneezing or four more stressful to your incision than any lifting he will do.   Lifting restrictions  As directed    Comments:     Avoid heavy lifting initially.  Do not push through pain.  You have no specific weight limit.  Coughing and sneezing or four more stressful to your incision than any lifting you will do. Pain will protect you from injury.  Therefore, avoid intense activity until off all narcotic pain medications.  Coughing and sneezing or four more stressful to your incision than any lifting he will do.   May shower / Bathe  As directed    May shower / Bathe  As directed    May walk up steps  As directed    May walk up steps  As directed    Sexual Activity Restrictions  As directed    Comments:     Sexual activity as tolerated.  Do not push through pain.  Pain will protect you from injury.   Sexual Activity Restrictions  As directed    Comments:     Sexual activity as tolerated.  Do not push through pain.  Pain will protect you from injury.   Walk with assistance  As directed    Comments:     Walk over an hour a day.  May use a walker/cane/companion to help with balance and stamina.   Walk with assistance  As directed    Comments:     Walk over an hour a day.  May use a walker/cane/companion to help with balance and stamina.       Medication List         albuterol 108 (90 BASE) MCG/ACT inhaler  Commonly known as:  PROVENTIL HFA;VENTOLIN HFA  Inhale 2 puffs into the lungs every 6 (six) hours as needed for wheezing or shortness of breath.     amLODipine 5 MG tablet  Commonly known as:  NORVASC  Take 5 mg by mouth daily with breakfast.  aspirin 81 MG tablet  Take 81 mg by mouth daily.     docusate sodium 100 MG capsule  Commonly known as:  COLACE  Take 100 mg by mouth daily.     Fluticasone-Salmeterol 250-50 MCG/DOSE Aepb  Commonly known as:  ADVAIR  Inhale 1 puff into the lungs daily as needed (weezing). 1 puff once  a day. Rinse mouth out well  after each use     guaiFENesin 600 MG 12 hr tablet  Commonly known as:  MUCINEX  Take by mouth 2 (two) times daily as needed.     ipratropium 0.02 % nebulizer solution  Commonly known as:  ATROVENT  Take 500 mcg by nebulization 4 (four) times daily.     losartan 50 MG tablet  Commonly known as:  COZAAR  Take 50 mg by mouth daily with breakfast.     metoprolol succinate 25 MG 24 hr tablet  Commonly known as:  TOPROL-XL  Take 25 mg by mouth 2 (two) times daily.     metoprolol tartrate 25 MG tablet  Commonly known as:  LOPRESSOR  Take 25 mg by mouth 2 (two) times daily.     Oxycodone HCl 10 MG Tabs  Take 0.5-1 tablets (5-10 mg total) by mouth every 4 (four) hours as needed.     simvastatin 40 MG tablet  Commonly known as:  ZOCOR  Take 40 mg by mouth. Takes at bedtime           Follow-up Information   Follow up with Gregroy Dombkowski C., MD. Schedule an appointment as soon as possible for a visit in 3 weeks.   Specialty:  General Surgery   Contact information:   [address] [place] [address] [phone]       Signed: Ardeth Sportsman. 03/14/2013, 6:42 AM

## 2013-03-24 ENCOUNTER — Encounter (INDEPENDENT_AMBULATORY_CARE_PROVIDER_SITE_OTHER): Payer: Self-pay | Admitting: Surgery

## 2013-03-24 ENCOUNTER — Ambulatory Visit (INDEPENDENT_AMBULATORY_CARE_PROVIDER_SITE_OTHER): Payer: Medicare Other | Admitting: Surgery

## 2013-03-24 VITALS — BP 130/80 | HR 82 | Temp 98.4°F | Resp 16 | Ht 66.0 in | Wt 183.4 lb

## 2013-03-24 DIAGNOSIS — K439 Ventral hernia without obstruction or gangrene: Secondary | ICD-10-CM

## 2013-03-24 DIAGNOSIS — H9190 Unspecified hearing loss, unspecified ear: Secondary | ICD-10-CM | POA: Insufficient documentation

## 2013-03-24 DIAGNOSIS — H9191 Unspecified hearing loss, right ear: Secondary | ICD-10-CM

## 2013-03-24 DIAGNOSIS — E669 Obesity, unspecified: Secondary | ICD-10-CM

## 2013-03-24 NOTE — Progress Notes (Signed)
Subjective:     Patient ID: [name], male   DOB: 04/10/1945, 67 y.o.   MRN: [id number]  HPI  Note: This dictation was prepared with Dragon/digital dictation along with [facility] technology. Any transcriptional errors that result from this process are unintentional.       COREYON NICOTRA  1945/04/21 [id number]  Patient Care Team: Johny Blamer, MD as PCP - General (Family Medicine) Hoyle Sauer, MD as Consulting Physician (Pulmonary Disease) Pricilla Riffle, MD as Consulting Physician (Cardiology)  Procedure (Date: 03/13/2013):  POST-OPERATIVE DIAGNOSIS: PERIUMBILICAL ABDOMINAL WALL HERNIA with diastasis recti   PROCEDURE: Procedure(s):  LAPAROSCOPIC VENTRAL WALL HERNIA REPAIR WITH INSERTION OF MESH  SURGEON: Surgeon(s):  Ardeth Sportsman, MD  OR FINDINGS: Patient had central region with some small Swiss cheese hernias in the setting of diastases recti. Largest periumbilical. Smaller hernia infraumbilical. 7 x 6 cm region.  Type of repair - Laparoscopic underlay repair  Name of mesh - Bard Ventralight dual sided (polypropylene / Seprafilm)  Size of mesh - Length 20 cm, Width 15 cm  Mesh overlap - 5-7 cm  Placement of mesh - Intraperitoneal underlay repair   This patient returns for surgical re-evaluation.  He is feeling well.  He did have drainage from the Steri-Strips sites for the first few days.  He called concerned.  Is otherwise doing okay.  I told him to remove all the dressings.  Everything is dried up now.  He feels well.  Off narcotics.  Just using ibuprofen.  Walking well.  Regular bowel movements.  No fevers or chills.  He is happy how things are going so far.  Patient Active Problem List   Diagnosis Date Noted  . Chronic abdominal pain  01/22/2013  . Constipation, chronic 01/22/2013  . Obesity - central abdominal 01/22/2013  . Ventral hernias - periumbilical, s/p lap repair w mesh 03/13/2013 09/21/2011  . MYOCARDIAL INFARCTION, HX OF 11/18/2008  .  PREMATURE VENTRICULAR CONTRACTIONS, FREQUENT 11/18/2008  . COPD 11/18/2008  . ARTHRITIS 11/18/2008  . APPENDECTOMY, HX OF 11/18/2008  . PERCUTANEOUS TRANSLUMINAL CORONARY ANGIOPLASTY, HX OF 11/18/2008  . HYPERLIPIDEMIA-MIXED 03/04/2008  . HYPERTENSION, BENIGN 03/04/2008  . CAD, NATIVE VESSEL 03/04/2008    Past Medical History  Diagnosis Date  . Arthropathy, unspecified, site unspecified   . Old myocardial infarction   . Other premature beats   . Chronic airway obstruction, not elsewhere classified   . Other and unspecified hyperlipidemia   . Essential hypertension, benign   . Coronary atherosclerosis of native coronary artery     DR. PAULA ROSS IS PT'S CARDIOLOGIST  . GERD (gastroesophageal reflux disease)     PT STATES MINOR HEARTBURN -WOULD TAKE MAALOX OR OTHER OVER THE COUNTER MED  . Arthritis     IN HANDS    Past Surgical History  Procedure Laterality Date  . Appendectomy    . Ptca  2008  . Coronary angioplasty    . Ventral hernia repair N/A 03/13/2013    Procedure: LAPAROSCOPIC VENTRAL WALL  HERNIA;  Surgeon: Ardeth Sportsman, MD;  Location: [facility];  Service: General;  Laterality: N/A;  . Insertion of mesh N/A 03/13/2013    Procedure: INSERTION OF MESH;  Surgeon: Ardeth Sportsman, MD;  Location: [facility];  Service: General;  Laterality: N/A;  . Hernia repair      History   Social History  . Marital Status: Divorced    Spouse Name: N/A    Number of Children: N/A  . Years  of Education: N/A   Occupational History  . 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 Smoking status: Former Smoker -- 2.00 packs/day for 40 years    Types: Cigarettes, Pipe, Cigars    Quit date: 03/27/2006  . Smokeless tobacco: Never Used  . Alcohol Use: No  . Drug Use: No  . Sexual Activity: Not on file   Other Topics Concern  . Not on file   Social History Narrative  . No narrative on file    Family History  Problem Relation Age of Onset  .  Heart disease Mother 65    CABG    Current Outpatient Prescriptions  Medication Sig Dispense Refill  . albuterol (PROVENTIL HFA;VENTOLIN HFA) 108 (90 BASE) MCG/ACT inhaler Inhale 2 puffs into the lungs every 6 (six) hours as needed for wheezing or shortness of breath.      Marland Kitchen amLODipine (NORVASC) 5 MG tablet Take 5 mg by mouth daily with breakfast.       . aspirin 81 MG tablet Take 81 mg by mouth daily.       Marland Kitchen docusate sodium (COLACE) 100 MG capsule Take 100 mg by mouth daily.      . Fluticasone-Salmeterol (ADVAIR) 250-50 MCG/DOSE AEPB Inhale 1 puff into the lungs daily as needed (weezing). 1 puff once  a day. Rinse mouth out well after each use      . guaiFENesin (MUCINEX) 600 MG 12 hr tablet Take by mouth 2 (two) times daily as needed.      Marland Kitchen ibuprofen (ADVIL,MOTRIN) 200 MG tablet Take 200 mg by mouth every 6 (six) hours as needed.      Marland Kitchen ipratropium (ATROVENT) 0.02 % nebulizer solution Take 500 mcg by nebulization 4 (four) times daily.      Marland Kitchen losartan (COZAAR) 50 MG tablet Take 50 mg by mouth daily with breakfast.      . metoprolol succinate (TOPROL-XL) 25 MG 24 hr tablet Take 25 mg by mouth 2 (two) times daily.      . metoprolol tartrate (LOPRESSOR) 25 MG tablet Take 25 mg by mouth 2 (two) times daily.      . simvastatin (ZOCOR) 40 MG tablet Take 40 mg by mouth. Takes at bedtime       No current facility-administered medications for this visit.     No Known Allergies  BP 130/80  Pulse 82  Temp(Src) 98.4 F (36.9 C) (Temporal)  Resp 16  Ht 5\' 6"  (1.676 m)  Wt 183 lb 6.4 oz (83.19 kg)  BMI 29.62 kg/m2  No results found.   Review of Systems  Constitutional: Negative for fever, chills and diaphoresis.  HENT: Negative for sore throat and trouble swallowing.   Eyes: Negative for photophobia and visual disturbance.  Respiratory: Negative for choking and shortness of breath.   Cardiovascular: Negative for chest pain and palpitations.  Gastrointestinal: Negative for nausea,  vomiting, abdominal distention, anal bleeding and rectal pain.  Genitourinary: Negative for dysuria, urgency, difficulty urinating and testicular pain.  Musculoskeletal: Negative for arthralgias, gait problem, myalgias and neck pain.  Skin: Negative for color change and rash.  Neurological: Negative for dizziness, speech difficulty, weakness and numbness.  Hematological: Negative for adenopathy.  Psychiatric/Behavioral: Negative for hallucinations, confusion and agitation.       Objective:   Physical Exam  Constitutional: He is oriented to person, place, and time. He appears well-developed and well-nourished. No distress.  HENT:  Head:  Normocephalic.  Mouth/Throat: Oropharynx is clear and moist. No oropharyngeal exudate.  Eyes: Conjunctivae and EOM are normal. Pupils are equal, round, and reactive to light. No scleral icterus.  Neck: Normal range of motion. No tracheal deviation present.  Cardiovascular: Normal rate, normal heart sounds and intact distal pulses.   Pulmonary/Chest: Effort normal. No respiratory distress.  Abdominal: Soft. He exhibits no distension. There is no tenderness. Hernia confirmed negative in the right inguinal area and confirmed negative in the left inguinal area.  Incisions clean with normal healing ridges.  No hernias.  Mild healing scabs in the left upper quadrant.  Most consistent with irritation at skin tapes/Tegaderms.  No cellulitis.  Everything dry.  Musculoskeletal: Normal range of motion. He exhibits no tenderness.  Neurological: He is alert and oriented to person, place, and time. No cranial nerve deficit. He exhibits normal muscle tone. Coordination normal.  Skin: Skin is warm and dry. No rash noted. He is not diaphoretic.  Psychiatric: He has a normal mood and affect. His behavior is normal.       Assessment:     Recovering rather well less than 2 weeks status post laparoscopic repair of periumbilical ventral wall hernias.     Plan:      Increase activity as tolerated to regular activity.  Low impact exercise such as walking an hour a day at least ideal.  Do not push through pain.  Diet as tolerated.  Low fat high fiber diet ideal.  Bowel regimen with 30 g fiber a day and fiber supplement as needed to avoid problems.  Return to clinic in 3 weeks just in case.  If he is feeling even better, he can cancel the appointment and just return to clinic as needed.   Instructions discussed.  Followup with primary care physician for other health issues as would normally be done.  Questions answered.  The patient expressed understanding and appreciation

## 2013-03-24 NOTE — Patient Instructions (Signed)
HERNIA REPAIR: POST OP INSTRUCTIONS  1. DIET: Follow a light bland diet the first 24 hours after arrival home, such as soup, liquids, crackers, etc.  Be sure to include lots of fluids daily.  Avoid fast food or heavy meals as your are more likely to get nauseated.  Eat a low fat the next few days after surgery. 2. Take your usually prescribed home medications unless otherwise directed. 3. PAIN CONTROL: a. Pain is best controlled by a usual combination of three different methods TOGETHER: i. Ice/Heat ii. Over the counter pain medication iii. Prescription pain medication b. Most patients will experience some swelling and bruising around the hernia(s) such as the bellybutton, groins, or old incisions.  Ice packs or heating pads (30-60 minutes up to 6 times a day) will help. Use ice for the first few days to help decrease swelling and bruising, then switch to heat to help relax tight/sore spots and speed recovery.  Some people prefer to use ice alone, heat alone, alternating between ice & heat.  Experiment to what works for you.  Swelling and bruising can take several weeks to resolve.   c. It is helpful to take an over-the-counter pain medication regularly for the first few weeks.  Choose one of the following that works best for you: i. Naproxen (Aleve, etc)  Two 220mg tabs twice a day ii. Ibuprofen (Advil, etc) Three 200mg tabs four times a day (every meal & bedtime) iii. Acetaminophen (Tylenol, etc) 325-650mg four times a day (every meal & bedtime) d. A  prescription for pain medication should be given to you upon discharge.  Take your pain medication as prescribed.  i. If you are having problems/concerns with the prescription medicine (does not control pain, nausea, vomiting, rash, itching, etc), please call us [phone] to see if we need to switch you to a different pain medicine that will work better for you and/or control your side effect better. ii. If you need a refill on your pain  medication, please contact your pharmacy.  They will contact our office to request authorization. Prescriptions will not be filled after 5 pm or on week-ends. 4. Avoid getting constipated.  Between the surgery and the pain medications, it is common to experience some constipation.  Increasing fluid intake and taking a fiber supplement (such as Metamucil, Citrucel, FiberCon, MiraLax, etc) 1-2 times a day regularly will usually help prevent this problem from occurring.  A mild laxative (prune juice, Milk of Magnesia, MiraLax, etc) should be taken according to package directions if there are no bowel movements after 48 hours.   5. Wash / shower every day.  You may shower over the dressings as they are waterproof.   6. Remove your waterproof bandages 5 days after surgery.  You may leave the incision open to air.  You may replace a dressing/Band-Aid to cover the incision for comfort if you wish.  Continue to shower over incision(s) after the dressing is off.    7. ACTIVITIES as tolerated:   a. You may resume regular (light) daily activities beginning the next day-such as daily self-care, walking, climbing stairs-gradually increasing activities as tolerated.  If you can walk 30 minutes without difficulty, it is safe to try more intense activity such as jogging, treadmill, bicycling, low-impact aerobics, swimming, etc. b. Save the most intensive and strenuous activity for last such as sit-ups, heavy lifting, contact sports, etc  Refrain from any heavy lifting or straining until you are off narcotics for pain control.     c. DO NOT PUSH THROUGH PAIN.  Let pain be your guide: If it hurts to do something, don't do it.  Pain is your body warning you to avoid that activity for another week until the pain goes down. d. You may drive when you are no longer taking prescription pain medication, you can comfortably wear a seatbelt, and you can safely maneuver your car and apply brakes. e. You may have sexual intercourse  when it is comfortable.  8. FOLLOW UP in our office a. Please call [facility] at [phone] to set up an appointment to see your surgeon in the office for a follow-up appointment approximately 2-3 weeks after your surgery. b. Make sure that you call for this appointment the day you arrive home to insure a convenient appointment time. 9.  IF YOU HAVE DISABILITY OR FAMILY LEAVE FORMS, BRING THEM TO THE OFFICE FOR PROCESSING.  DO NOT GIVE THEM TO YOUR DOCTOR.  WHEN TO CALL US [phone]: 1. Poor pain control 2. Reactions / problems with new medications (rash/itching, nausea, etc)  3. Fever over 101.5 F (38.5 C) 4. Inability to urinate 5. Nausea and/or vomiting 6. Worsening swelling or bruising 7. Continued bleeding from incision. 8. Increased pain, redness, or drainage from the incision   The clinic staff is available to answer your questions during regular business hours (8:30am-5pm).  Please don't hesitate to call and ask to speak to one of our nurses for clinical concerns.   If you have a medical emergency, go to the nearest emergency room or call 911.  A surgeon from [facility] is always on call at the hospitals in [place]  [facility] [address], [address], [place], [place]  [address] ?  [address] [address], [place], [place]   [address] MAIN: [phone] ? TOLL FREE: [phone] ? FAX: [phone] [email or web address]  Managing Pain  Pain after surgery or related to activity is often due to strain/injury to muscle, tendon, nerves and/or incisions.  This pain is usually short-term and will improve in a few months.   Many people find it helpful to do the following things TOGETHER to help speed the process of healing and to get back to regular activity more quickly:  1. Avoid heavy physical activity a.  no lifting greater than 20 pounds b. Do not "push through" the pain.  Listen to your body and avoid positions and maneuvers than  reproduce the pain c. Walking is okay as tolerated, but go slowly and stop when getting sore.  d. Remember: If it hurts to do it, then don't do it! 2. Take Anti-inflammatory medication  a. Take with food/snack around the clock for 1-2 weeks i. This helps the muscle and nerve tissues become less irritable and calm down faster b. Choose ONE of the following over-the-counter medications: i. Naproxen 220mg tabs (ex. Aleve) 1-2 pills twice a day  ii. Ibuprofen 200mg tabs (ex. Advil, Motrin) 3-4 pills with every meal and just before bedtime iii. Acetaminophen 500mg tabs (Tylenol) 1-2 pills with every meal and just before bedtime 3. Use a Heating pad or Ice/Cold Pack a. 4-6 times a day b. May use warm bath/hottub  or showers 4. Try Gentle Massage and/or Stretching  a. at the area of pain many times a day b. stop if you feel pain - do not overdo it  Try these steps together to help you body heal faster and avoid making things get worse.  Doing just one of these   things may not be enough.    If you are not getting better after two weeks or are noticing you are getting worse, contact our office for further advice; we may need to re-evaluate you & see what other things we can do to help.  Exercise to Lose Weight Exercise and a healthy diet may help you lose weight. Your doctor may suggest specific exercises. EXERCISE IDEAS AND TIPS  Choose low-cost things you enjoy doing, such as walking, bicycling, or exercising to workout videos.  Take stairs instead of the elevator.  Walk during your lunch break.  Park your car further away from work or school.  Go to a gym or an exercise class.  Start with 5 to 10 minutes of exercise each day. Build up to 30 minutes of exercise 4 to 6 days a week.  Wear shoes with good support and comfortable clothes.  Stretch before and after working out.  Work out until you breathe harder and your heart beats faster.  Drink extra water when you exercise.  Do  not do so much that you hurt yourself, feel dizzy, or get very short of breath. Exercises that burn about 150 calories:  Running 1  miles in 15 minutes.  Playing volleyball for 45 to 60 minutes.  Washing and waxing a car for 45 to 60 minutes.  Playing touch football for 45 minutes.  Walking 1  miles in 35 minutes.  Pushing a stroller 1  miles in 30 minutes.  Playing basketball for 30 minutes.  Raking leaves for 30 minutes.  Bicycling 5 miles in 30 minutes.  Walking 2 miles in 30 minutes.  Dancing for 30 minutes.  Shoveling snow for 15 minutes.  Swimming laps for 20 minutes.  Walking up stairs for 15 minutes.  Bicycling 4 miles in 15 minutes.  Gardening for 30 to 45 minutes.  Jumping rope for 15 minutes.  Washing windows or floors for 45 to 60 minutes. Document Released: 04/15/2010 Document Revised: 06/05/2011 Document Reviewed: 04/15/2010 [employer] Patient Information 2014 [employer].  

## 2013-04-15 ENCOUNTER — Encounter (INDEPENDENT_AMBULATORY_CARE_PROVIDER_SITE_OTHER): Payer: Medicare Other | Admitting: Surgery

## 2013-05-30 ENCOUNTER — Other Ambulatory Visit: Payer: Self-pay | Admitting: Adult Health

## 2013-06-02 ENCOUNTER — Telehealth: Payer: Self-pay | Admitting: Pulmonary Disease

## 2013-06-02 MED ORDER — FLUTICASONE-SALMETEROL 250-50 MCG/DOSE IN AEPB: 1.0000 | INHALATION_SPRAY | Freq: Every day | RESPIRATORY_TRACT | Status: DC | PRN

## 2013-06-02 NOTE — Telephone Encounter (Signed)
Rx has been sent in. Pt is aware. 

## 2013-06-28 ENCOUNTER — Other Ambulatory Visit: Payer: Self-pay | Admitting: Internal Medicine

## 2013-07-27 ENCOUNTER — Other Ambulatory Visit: Payer: Self-pay | Admitting: Pulmonary Disease

## 2013-08-11 ENCOUNTER — Other Ambulatory Visit: Payer: Self-pay | Admitting: Pulmonary Disease

## 2013-08-27 ENCOUNTER — Telehealth: Payer: Self-pay | Admitting: Pulmonary Disease

## 2013-08-27 MED ORDER — FLUTICASONE-SALMETEROL 250-50 MCG/DOSE IN AEPB: 1.0000 | INHALATION_SPRAY | Freq: Two times a day (BID) | RESPIRATORY_TRACT | Status: DC

## 2013-08-27 NOTE — Telephone Encounter (Signed)
Spoke with the pt  He states needing rx for advair  I have sent this to pharm and scheduled him for ov with RA since he was overdue  Nothing further needed

## 2013-09-29 ENCOUNTER — Encounter: Payer: Self-pay | Admitting: Pulmonary Disease

## 2013-09-29 ENCOUNTER — Ambulatory Visit: Payer: Medicare Other | Admitting: Pulmonary Disease

## 2013-09-29 VITALS — BP 140/80 | HR 57 | Ht 67.0 in | Wt 176.6 lb

## 2013-09-29 DIAGNOSIS — J449 Chronic obstructive pulmonary disease, unspecified: Secondary | ICD-10-CM

## 2013-09-29 MED ORDER — FLUTICASONE-SALMETEROL 250-50 MCG/DOSE IN AEPB: 1.0000 | INHALATION_SPRAY | Freq: Two times a day (BID) | RESPIRATORY_TRACT | Status: DC

## 2013-09-29 NOTE — Assessment & Plan Note (Signed)
Refills on advair Use mucinex as needed daily for congestion Breathing test showed low lung capacity at 26% - disability temp card filled out Cleared for cataract surgery

## 2013-09-29 NOTE — Progress Notes (Signed)
   Subjective:    Patient ID: [name], male    DOB: Jul 27, 1945, 68 y.o.   MRN: [id number]  HPI  67/M, ex smoker for FU of severe COPD - gold stg iV  46 PY smoker , quit 2008, when he had an MI   PFT showed FEv1 23% with good response to bronchodilator improving to 30%, air trapping & decreased diffusion c/w   07/12/2011 spiriva helped a little bit in the past - does not want to try it due to fixed income  5.19.14 ONO-desatn  Did not see any improvement with noct o2- returned it     09/29/2013  552m FU  Underwent Hernia surgery last year - uneventful Cataract surgery planned  Wants disability parking placard FEv1 -0.82   Denies any wheezing, no chest tx, no wheezing. If he does he uses his rescue inhaler and it helps.   He remains on Advair twice daily along with Atrovent nebulizers 4 times daily.  [facility].  He's had no hospitalizations or emergency room visits since last seen.  He has rare albuterol use.  Last chest x-ray, 08/01/2012 with no acute findings.   SPirometry -FEV1 0.82- 26%  Review of Systems neg for any significant sore throat, dysphagia, itching, sneezing, nasal congestion or excess/ purulent secretions, fever, chills, sweats, unintended wt loss, pleuritic or exertional cp, hempoptysis, orthopnea pnd or change in chronic leg swelling. Also denies presyncope, palpitations, heartburn, abdominal pain, nausea, vomiting, diarrhea or change in bowel or urinary habits, dysuria,hematuria, rash, arthralgias, visual complaints, headache, numbness weakness or ataxia.      Objective:   Physical Exam  Gen. Pleasant, well-nourished, in no distress, normal affect ENT - no lesions, no post nasal drip Neck: No JVD, no thyromegaly, no carotid bruits Lungs: no use of accessory muscles, no dullness to percussion, clear without rales or rhonchi  Cardiovascular: Rhythm regular, heart sounds  normal, no murmurs or gallops, no  peripheral edema Abdomen: soft and non-tender, no hepatosplenomegaly, BS normal. Musculoskeletal: No deformities, no cyanosis or clubbing Neuro:  alert, non focal       Assessment & Plan:

## 2013-09-29 NOTE — Patient Instructions (Signed)
Refills on advair Use mucinex as needed daily for congestion Breathing test showed low lung capacity at 26%

## 2013-11-12 ENCOUNTER — Other Ambulatory Visit: Payer: Self-pay | Admitting: Pulmonary Disease

## 2013-12-03 ENCOUNTER — Other Ambulatory Visit: Payer: Self-pay

## 2013-12-05 ENCOUNTER — Encounter: Payer: Self-pay | Admitting: Internal Medicine

## 2013-12-05 ENCOUNTER — Ambulatory Visit (INDEPENDENT_AMBULATORY_CARE_PROVIDER_SITE_OTHER): Payer: Medicare Other | Admitting: Internal Medicine

## 2013-12-05 VITALS — BP 132/76 | HR 66 | Ht 67.0 in | Wt 180.0 lb

## 2013-12-05 DIAGNOSIS — E785 Hyperlipidemia, unspecified: Secondary | ICD-10-CM

## 2013-12-05 DIAGNOSIS — I10 Essential (primary) hypertension: Secondary | ICD-10-CM

## 2013-12-05 DIAGNOSIS — I4949 Other premature depolarization: Secondary | ICD-10-CM

## 2013-12-05 LAB — BASIC METABOLIC PANEL
BUN: 9 mg/dL (ref 6–23)
CALCIUM: 9.3 mg/dL (ref 8.4–10.5)
CO2: 30 mEq/L (ref 19–32)
Chloride: 107 mEq/L (ref 96–112)
Creatinine, Ser: 0.9 mg/dL (ref 0.4–1.5)
GFR: 85.58 mL/min (ref >60.00)
Glucose, Bld: 95 mg/dL (ref 70–99)
POTASSIUM: 3.5 meq/L (ref 3.5–5.1)
SODIUM: 139 meq/L (ref 135–145)

## 2013-12-05 LAB — AST: AST: 38 U/L — AB (ref 0–37)

## 2013-12-05 LAB — LIPID PANEL
CHOL/HDL RATIO: 2
CHOLESTEROL: 132 mg/dL (ref 0–200)
HDL: 53.8 mg/dL (ref >39.00)
LDL Cholesterol: 63 mg/dL (ref 0–99)
NonHDL: 78.2
TRIGLYCERIDES: 77 mg/dL (ref 0.0–149.0)
VLDL: 15.4 mg/dL (ref 0.0–40.0)

## 2013-12-05 LAB — CBC
HEMATOCRIT: 42.9 % (ref 39.0–52.0)
Hemoglobin: 14.3 g/dL (ref 13.0–17.0)
MCHC: 33.4 g/dL (ref 30.0–36.0)
MCV: 91.8 fl (ref 78.0–100.0)
Platelets: 111 10*3/uL — ABNORMAL LOW (ref 150.0–400.0)
RBC: 4.68 Mil/uL (ref 4.22–5.81)
RDW: 15 % (ref 11.5–15.5)
WBC: 8.1 10*3/uL (ref 4.0–10.5)

## 2013-12-05 MED ORDER — METOPROLOL TARTRATE 25 MG PO TABS: ORAL_TABLET | ORAL | Status: AC

## 2013-12-05 MED ORDER — LOSARTAN POTASSIUM 50 MG PO TABS: 50.0000 mg | ORAL_TABLET | Freq: Every day | ORAL | Status: AC

## 2013-12-05 MED ORDER — AMLODIPINE BESYLATE 5 MG PO TABS: 5.0000 mg | ORAL_TABLET | Freq: Every day | ORAL | Status: AC

## 2013-12-05 MED ORDER — SIMVASTATIN 40 MG PO TABS: 40.0000 mg | ORAL_TABLET | Freq: Every day | ORAL | Status: AC

## 2013-12-05 NOTE — Progress Notes (Signed)
[name] Kitchen HPI Patient is a 68 year old iwht a history of CAD (s/p NSTEMI in 2008 with PTCA/DES to RCA).  I saw him in clinic in Fall 2014  SInce seen he has done well  He denies CP  Breathing is stable  Had URI this summer. Uses exercise bike No Known Allergies  Current Outpatient Prescriptions  Medication Sig Dispense Refill  . amLODipine (NORVASC) 5 MG tablet Take 5 mg by mouth daily with breakfast.       . aspirin 81 MG tablet Take 81 mg by mouth daily.       [name] Kitchen docusate sodium (COLACE) 100 MG capsule Take 100 mg by mouth daily.      . Fluticasone-Salmeterol (ADVAIR DISKUS) 250-50 MCG/DOSE AEPB Inhale 1 puff into the lungs 2 (two) times daily.  60 each  2  . guaiFENesin (MUCINEX) 600 MG 12 hr tablet Take by mouth 2 (two) times daily as needed.      [name] Kitchen ibuprofen (ADVIL,MOTRIN) 200 MG tablet Take 200 mg by mouth every 6 (six) hours as needed.      [name] Kitchen ipratropium (ATROVENT) 0.02 % nebulizer solution Take 500 mcg by nebulization 3 (three) times daily.       [name] Kitchen losartan (COZAAR) 50 MG tablet Take 50 mg by mouth daily with breakfast.      . metoprolol tartrate (LOPRESSOR) 25 MG tablet TAKE 1 TABLET (25 MG TOTAL) BY MOUTH 2 (TWO) TIMES DAILY.  60 tablet  6  . PROAIR HFA 108 (90 BASE) MCG/ACT inhaler USE 1 TO 2 PUFFS BY MOUTH EVERY 6 HOURS AS NEEDED FOR WHEEZING OR SHORTNESS OF BREATH  8.5 each  0  . simvastatin (ZOCOR) 40 MG tablet Take 40 mg by mouth. Takes at bedtime       No current facility-administered medications for this visit.    Past Medical History  Diagnosis Date  . Arthropathy, unspecified, site unspecified   . Old myocardial infarction   . Other premature beats   . Chronic airway obstruction, not elsewhere classified   . Other and unspecified hyperlipidemia   . Essential hypertension, benign   . Coronary atherosclerosis of native coronary artery     DR. Adraine Biffle IS PT'S CARDIOLOGIST  . GERD (gastroesophageal reflux disease)     PT STATES MINOR HEARTBURN -WOULD TAKE MAALOX OR OTHER  OVER THE COUNTER MED  . Arthritis     IN HANDS  . Ventral hernias - periumbilical, s/p lap repair w mesh 03/13/2013 09/21/2011    Past Surgical History  Procedure Laterality Date  . Appendectomy    . Ptca  2008  . Coronary angioplasty    . Ventral hernia repair N/A 03/13/2013    Procedure: LAPAROSCOPIC VENTRAL WALL  HERNIA;  Surgeon: Ardeth Sportsman, MD;  Location: [facility];  Service: General;  Laterality: N/A;  . Insertion of mesh N/A 03/13/2013    Procedure: INSERTION OF MESH;  Surgeon: Ardeth Sportsman, MD;  Location: [facility];  Service: General;  Laterality: N/A;  . Hernia repair      Family History  Problem Relation Age of Onset  . Heart disease Mother 36    CABG    History   Social History  . Marital Status: Divorced    Spouse Name: N/A    Number of Children: N/A  . Years of Education: N/A   Occupational History  . 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  Smoking status: Former Smoker -- 2.00 packs/day for 40 years    Types: Cigarettes, Pipe, Cigars    Quit date: 03/27/2006  . Smokeless tobacco: Never Used  . Alcohol Use: No  . Drug Use: No  . Sexual Activity: Not on file   Other Topics Concern  . Not on file   Social History Narrative  . No narrative on file    Review of Systems:  All systems reviewed.  They are negative to the above problem except as previously stated.  Vital Signs: BP 132/76  Pulse 66  Ht  (1.702 m)  Wt 180 lb (81.647 kg)  BMI 28.19 kg/m2  Physical Exam Patient is in NAD HEENT:  Normocephalic, atraumatic. EOMI, PERRLA.  Neck: JVP is normal.  No bruits.  Lungs: Decreased airflow.. No rales no wheezes.  Heart: Regular rate and rhythm. Normal S1, S2. No S3.   No significant murmurs. PMI not displaced.  Abdomen:  Supple, nontender. Normal bowel sounds. No masses. No hepatomegaly.  Extremities:   Good distal pulses throughout. No lower extremity edema.  Musculoskeletal :moving all  extremities.  Neuro:   alert and oriented x3.  CN II-XII grossly intact.  EKG:  SR 66 bpm  . Assessment and Plan:  1.  CAD.  No symptoms of angina.  Keep on same regimen  Check CBC 2.  HTN.  Good control. Keep on same regimen    3.  HL. Lipids today  I would keep him on the same dose of Zocor if his lipids are good since he tolerated it.

## 2013-12-05 NOTE — Patient Instructions (Signed)
Your physician recommends that you return for lab work TODAY (CBC, BMET, AST, LIPIDS)  Your physician wants you to follow-up in: 1 YEAR WITH DR ROSS.  You will receive a reminder letter in the mail two months in advance. If you don't receive a letter, please call our office to schedule the follow-up appointment.

## 2013-12-07 ENCOUNTER — Encounter: Payer: Self-pay | Admitting: *Deleted

## 2014-01-26 ENCOUNTER — Other Ambulatory Visit: Payer: Self-pay | Admitting: Pulmonary Disease

## 2014-02-25 ENCOUNTER — Other Ambulatory Visit: Payer: Self-pay | Admitting: Pulmonary Disease

## 2014-03-30 ENCOUNTER — Ambulatory Visit: Payer: Medicare Other | Admitting: Adult Health

## 2014-04-03 ENCOUNTER — Ambulatory Visit (INDEPENDENT_AMBULATORY_CARE_PROVIDER_SITE_OTHER): Payer: Medicare Other | Admitting: Adult Health

## 2014-04-03 ENCOUNTER — Encounter: Payer: Self-pay | Admitting: Adult Health

## 2014-04-03 VITALS — BP 110/60 | HR 64 | Ht 67.0 in | Wt 169.2 lb

## 2014-04-03 DIAGNOSIS — Z23 Encounter for immunization: Secondary | ICD-10-CM

## 2014-04-03 DIAGNOSIS — J449 Chronic obstructive pulmonary disease, unspecified: Secondary | ICD-10-CM

## 2014-04-03 MED ORDER — ALBUTEROL SULFATE HFA 108 (90 BASE) MCG/ACT IN AERS: INHALATION_SPRAY | RESPIRATORY_TRACT | Status: AC

## 2014-04-03 NOTE — Assessment & Plan Note (Signed)
Compensated on regimen  ? Side effect/allergy to Atrovent neb  If declines off, consider adding spiriva   Plan  May stop  Ipratropium Neb  Prevnar vaccine today .  Continue on Advair 1 puff Twice daily  , rinse after use.  follow up Dr. Vassie LollAlva  In 6 months and As needed   Please contact office for sooner follow up if symptoms do not improve or worsen or seek emergency care

## 2014-04-03 NOTE — Progress Notes (Signed)
   Subjective:    Patient ID: [name], male    DOB: 1945/09/17, 69 y.o.   MRN: [id number]  HPI  67/M, ex smoker for FU of severe COPD - gold stg iV  46 PY smoker , quit 2008, when he had an MI   PFT showed FEv1 23% with good response to bronchodilator improving to 30%, air trapping & decreased diffusion c/w   07/12/2011 spiriva helped a little bit in the past - does not want to try it due to fixed income  5.19.14 ONO-desatn  Did not see any improvement with noct o2- returned it     09/29/13 5362m FU  Underwent Hernia surgery last year - uneventful Cataract surgery planned  Wants disability parking placard FEv1 -0.82   Denies any wheezing, no chest tx, no wheezing. If he does he uses his rescue inhaler and it helps.   He remains on Advair twice daily along with Atrovent nebulizers 4 times daily.  [facility].  He's had no hospitalizations or emergency room visits since last seen.  He has rare albuterol use.  Last chest x-ray, 08/01/2012 with no acute findings.   SPirometry -FEV1 0.82- 26%   04/03/2014 Follow up-Severe COPD  Pt returns for  6 month COPD Follow up.  Reports breathing is doing well.   Reports dry mouth from atrovent, says that over last month every time he uses his neb it causes his lips to burn and swell. Stopped using it over the last week, lips return to normal .  No change in his breathing off nebs.  He denies any chest pain, orthopnea, PND, or increased leg swelling or hemoptysis   Review of Systems neg for any significant sore throat, dysphagia, itching, sneezing, nasal congestion or excess/ purulent secretions, fever, chills, sweats, unintended wt loss, pleuritic or exertional cp, hempoptysis, orthopnea pnd or change in chronic leg swelling. Also denies presyncope, palpitations, heartburn, abdominal pain, nausea, vomiting, diarrhea or change in bowel or urinary habits, dysuria,hematuria, rash, arthralgias,  visual complaints, headache, numbness weakness or ataxia.      Objective:   Physical Exam  Gen. Pleasant, elderly , in no distress, normal affect ENT - no lesions, no post nasal drip Neck: No JVD, no thyromegaly, no carotid bruits Lungs: no use of accessory muscles, no dullness to percussion, clear w/ decreased BS  Cardiovascular: Rhythm regular, heart sounds  normal, no murmurs or gallops, no peripheral edema Abdomen: soft and non-tender, no hepatosplenomegaly, BS normal. Musculoskeletal: No deformities, no cyanosis or clubbing Neuro:  alert, non focal       Assessment & Plan:

## 2014-04-03 NOTE — Patient Instructions (Addendum)
May stop  Ipratropium Neb  Prevnar vaccine today .  Continue on Advair 1 puff Twice daily  , rinse after use.  follow up Dr. Vassie LollAlva  In 6 months and As needed   Please contact office for sooner follow up if symptoms do not improve or worsen or seek emergency care

## 2014-04-06 NOTE — Progress Notes (Signed)
Reviewed & agree with plan  

## 2014-04-27 ENCOUNTER — Telehealth: Payer: Self-pay | Admitting: Pulmonary Disease

## 2014-04-27 MED ORDER — FLUTICASONE-SALMETEROL 250-50 MCG/DOSE IN AEPB: 1.0000 | INHALATION_SPRAY | Freq: Two times a day (BID) | RESPIRATORY_TRACT | Status: AC

## 2014-04-27 NOTE — Telephone Encounter (Signed)
Rx has been sent in. Pt is aware. Nothing further was needed. 

## 2014-04-27 NOTE — Telephone Encounter (Signed)
Pt would like a couple of scripts called in on this prescript so that he what have to call next month please advise pt when called in he can be reached @ [phone].Joseph GriffinsStanley A Herring

## 2014-06-29 ENCOUNTER — Encounter (HOSPITAL_COMMUNITY): Admission: EM | Disposition: E | Payer: Medicare Other | Source: Home / Self Care | Attending: Pulmonary Disease

## 2014-06-29 ENCOUNTER — Inpatient Hospital Stay (HOSPITAL_COMMUNITY)
Admission: EM | Admit: 2014-06-29 | Discharge: 2014-07-26 | DRG: 987 | Disposition: E | Payer: Medicare Other | Attending: Pulmonary Disease | Admitting: Pulmonary Disease

## 2014-06-29 ENCOUNTER — Inpatient Hospital Stay (HOSPITAL_COMMUNITY): Payer: Medicare Other | Admitting: Certified Registered Nurse Anesthetist

## 2014-06-29 ENCOUNTER — Inpatient Hospital Stay (HOSPITAL_COMMUNITY): Payer: Medicare Other

## 2014-06-29 ENCOUNTER — Encounter (HOSPITAL_COMMUNITY): Payer: Self-pay

## 2014-06-29 ENCOUNTER — Emergency Department (HOSPITAL_COMMUNITY): Payer: Medicare Other

## 2014-06-29 DIAGNOSIS — I469 Cardiac arrest, cause unspecified: Secondary | ICD-10-CM | POA: Diagnosis not present

## 2014-06-29 DIAGNOSIS — K219 Gastro-esophageal reflux disease without esophagitis: Secondary | ICD-10-CM | POA: Diagnosis present

## 2014-06-29 DIAGNOSIS — G9341 Metabolic encephalopathy: Secondary | ICD-10-CM | POA: Diagnosis not present

## 2014-06-29 DIAGNOSIS — D696 Thrombocytopenia, unspecified: Secondary | ICD-10-CM | POA: Diagnosis not present

## 2014-06-29 DIAGNOSIS — Z7982 Long term (current) use of aspirin: Secondary | ICD-10-CM | POA: Diagnosis not present

## 2014-06-29 DIAGNOSIS — I10 Essential (primary) hypertension: Secondary | ICD-10-CM | POA: Diagnosis present

## 2014-06-29 DIAGNOSIS — N179 Acute kidney failure, unspecified: Secondary | ICD-10-CM | POA: Diagnosis not present

## 2014-06-29 DIAGNOSIS — D62 Acute posthemorrhagic anemia: Secondary | ICD-10-CM | POA: Diagnosis present

## 2014-06-29 DIAGNOSIS — Z515 Encounter for palliative care: Secondary | ICD-10-CM | POA: Diagnosis not present

## 2014-06-29 DIAGNOSIS — Z66 Do not resuscitate: Secondary | ICD-10-CM | POA: Diagnosis not present

## 2014-06-29 DIAGNOSIS — J9601 Acute respiratory failure with hypoxia: Secondary | ICD-10-CM | POA: Diagnosis not present

## 2014-06-29 DIAGNOSIS — J690 Pneumonitis due to inhalation of food and vomit: Secondary | ICD-10-CM | POA: Diagnosis not present

## 2014-06-29 DIAGNOSIS — I2510 Atherosclerotic heart disease of native coronary artery without angina pectoris: Secondary | ICD-10-CM | POA: Diagnosis present

## 2014-06-29 DIAGNOSIS — Z7951 Long term (current) use of inhaled steroids: Secondary | ICD-10-CM

## 2014-06-29 DIAGNOSIS — Z87891 Personal history of nicotine dependence: Secondary | ICD-10-CM | POA: Diagnosis not present

## 2014-06-29 DIAGNOSIS — R578 Other shock: Secondary | ICD-10-CM | POA: Diagnosis not present

## 2014-06-29 DIAGNOSIS — Z79899 Other long term (current) drug therapy: Secondary | ICD-10-CM

## 2014-06-29 DIAGNOSIS — J9600 Acute respiratory failure, unspecified whether with hypoxia or hypercapnia: Secondary | ICD-10-CM | POA: Diagnosis not present

## 2014-06-29 DIAGNOSIS — E785 Hyperlipidemia, unspecified: Secondary | ICD-10-CM | POA: Diagnosis present

## 2014-06-29 DIAGNOSIS — R0603 Acute respiratory distress: Secondary | ICD-10-CM

## 2014-06-29 DIAGNOSIS — J449 Chronic obstructive pulmonary disease, unspecified: Secondary | ICD-10-CM | POA: Diagnosis present

## 2014-06-29 DIAGNOSIS — I248 Other forms of acute ischemic heart disease: Secondary | ICD-10-CM | POA: Diagnosis not present

## 2014-06-29 DIAGNOSIS — K922 Gastrointestinal hemorrhage, unspecified: Secondary | ICD-10-CM | POA: Diagnosis present

## 2014-06-29 DIAGNOSIS — Z452 Encounter for adjustment and management of vascular access device: Secondary | ICD-10-CM

## 2014-06-29 DIAGNOSIS — R4020 Unspecified coma: Secondary | ICD-10-CM | POA: Diagnosis not present

## 2014-06-29 DIAGNOSIS — I864 Gastric varices: Secondary | ICD-10-CM | POA: Diagnosis present

## 2014-06-29 DIAGNOSIS — K7460 Unspecified cirrhosis of liver: Secondary | ICD-10-CM | POA: Diagnosis present

## 2014-06-29 DIAGNOSIS — M5000 Cervical disc disorder with myelopathy, unspecified cervical region: Secondary | ICD-10-CM | POA: Diagnosis present

## 2014-06-29 DIAGNOSIS — Z8249 Family history of ischemic heart disease and other diseases of the circulatory system: Secondary | ICD-10-CM | POA: Diagnosis not present

## 2014-06-29 DIAGNOSIS — I252 Old myocardial infarction: Secondary | ICD-10-CM

## 2014-06-29 HISTORY — PX: ESOPHAGOGASTRODUODENOSCOPY (EGD) WITH PROPOFOL: SHX5813

## 2014-06-29 LAB — CBC
HEMATOCRIT: 29.6 % — AB (ref 39.0–52.0)
HEMATOCRIT: 21.7 % — AB (ref 39.0–52.0)
HEMOGLOBIN: 7.5 g/dL — AB (ref 13.0–17.0)
Hemoglobin: 10.2 g/dL — ABNORMAL LOW (ref 13.0–17.0)
MCH: 31.3 pg (ref 26.0–34.0)
MCH: 31.3 pg (ref 26.0–34.0)
MCHC: 34.5 g/dL (ref 30.0–36.0)
MCHC: 34.6 g/dL (ref 30.0–36.0)
MCV: 90.8 fL (ref 78.0–100.0)
MCV: 90.4 fL (ref 78.0–100.0)
Platelets: 218 10*3/uL (ref 150–400)
Platelets: 241 10*3/uL (ref 150–400)
RBC: 3.26 MIL/uL — ABNORMAL LOW (ref 4.22–5.81)
RBC: 2.4 MIL/uL — ABNORMAL LOW (ref 4.22–5.81)
RDW: 15.5 % (ref 11.5–15.5)
RDW: 15.5 % (ref 11.5–15.5)
WBC: 16.1 10*3/uL — ABNORMAL HIGH (ref 4.0–10.5)
WBC: 21.2 10*3/uL — ABNORMAL HIGH (ref 4.0–10.5)

## 2014-06-29 LAB — MRSA PCR SCREENING: MRSA by PCR: POSITIVE — AB

## 2014-06-29 LAB — COMPREHENSIVE METABOLIC PANEL
ALBUMIN: 2.5 g/dL — AB (ref 3.5–5.2)
ALK PHOS: 66 U/L (ref 39–117)
ALT: 24 U/L (ref 0–53)
AST: 40 U/L — ABNORMAL HIGH (ref 0–37)
Anion gap: 10 (ref 5–15)
BILIRUBIN TOTAL: 0.5 mg/dL (ref 0.3–1.2)
BUN: 46 mg/dL — AB (ref 6–23)
CO2: 23 mmol/L (ref 19–32)
CREATININE: 1.27 mg/dL (ref 0.50–1.35)
Calcium: 9 mg/dL (ref 8.4–10.5)
Chloride: 106 mmol/L (ref 96–112)
GFR, EST AFRICAN AMERICAN: 65 mL/min — AB (ref >90)
GFR, EST NON AFRICAN AMERICAN: 56 mL/min — AB (ref >90)
Glucose, Bld: 208 mg/dL — ABNORMAL HIGH (ref 70–99)
Potassium: 4.2 mmol/L (ref 3.5–5.1)
Sodium: 139 mmol/L (ref 135–145)
Total Protein: 4.7 g/dL — ABNORMAL LOW (ref 6.0–8.3)

## 2014-06-29 LAB — CBC WITH DIFFERENTIAL/PLATELET
Basophils Absolute: 0 10*3/uL (ref 0.0–0.1)
Basophils Relative: 0 % (ref 0–1)
EOS PCT: 0 % (ref 0–5)
Eosinophils Absolute: 0 10*3/uL (ref 0.0–0.7)
HEMATOCRIT: 32.8 % — AB (ref 39.0–52.0)
HEMOGLOBIN: 11.1 g/dL — AB (ref 13.0–17.0)
Lymphocytes Relative: 38 % (ref 12–46)
Lymphs Abs: 8.9 10*3/uL — ABNORMAL HIGH (ref 0.7–4.0)
MCH: 30.7 pg (ref 26.0–34.0)
MCHC: 33.8 g/dL (ref 30.0–36.0)
MCV: 90.6 fL (ref 78.0–100.0)
MONOS PCT: 13 % — AB (ref 3–12)
Monocytes Absolute: 3 10*3/uL — ABNORMAL HIGH (ref 0.1–1.0)
NEUTROS ABS: 11.4 10*3/uL — AB (ref 1.7–7.7)
Neutrophils Relative %: 49 % (ref 43–77)
Platelets: 219 10*3/uL (ref 150–400)
RBC: 3.62 MIL/uL — ABNORMAL LOW (ref 4.22–5.81)
RDW: 15.1 % (ref 11.5–15.5)
WBC: 23.3 10*3/uL — AB (ref 4.0–10.5)

## 2014-06-29 LAB — POC OCCULT BLOOD, ED: Fecal Occult Bld: POSITIVE — AB

## 2014-06-29 LAB — I-STAT TROPONIN, ED: Troponin i, poc: 0.01 ng/mL (ref 0.00–0.08)

## 2014-06-29 LAB — ABO/RH: ABO/RH(D): O NEG

## 2014-06-29 LAB — TROPONIN I
TROPONIN I: 0.04 ng/mL — AB (ref <0.031)
TROPONIN I: 0.15 ng/mL — AB (ref <0.031)
Troponin I: 0.25 ng/mL — ABNORMAL HIGH (ref <0.031)

## 2014-06-29 SURGERY — ESOPHAGOGASTRODUODENOSCOPY (EGD) WITH PROPOFOL
Anesthesia: Monitor Anesthesia Care

## 2014-06-29 MED ORDER — LACTATED RINGERS IV SOLN
INTRAVENOUS | Status: DC | PRN
  Administered 2014-06-29 (×2): via INTRAVENOUS

## 2014-06-29 MED ORDER — PANTOPRAZOLE SODIUM 40 MG IV SOLR
40.0000 mg | Freq: Once | INTRAVENOUS | Status: AC
  Administered 2014-06-29: 40 mg via INTRAVENOUS
  Filled 2014-06-29: qty 40

## 2014-06-29 MED ORDER — SODIUM CHLORIDE 0.9 % IV BOLUS (SEPSIS): 1000.0000 mL | INTRAVENOUS | Status: DC | PRN

## 2014-06-29 MED ORDER — ALUM & MAG HYDROXIDE-SIMETH 200-200-20 MG/5ML PO SUSP: 30.0000 mL | Freq: Four times a day (QID) | ORAL | Status: DC | PRN

## 2014-06-29 MED ORDER — MORPHINE SULFATE 4 MG/ML IJ SOLN
4.0000 mg | Freq: Once | INTRAMUSCULAR | Status: AC
  Administered 2014-06-29: 4 mg via INTRAVENOUS
  Filled 2014-06-29: qty 1

## 2014-06-29 MED ORDER — PROPOFOL 10 MG/ML IV BOLUS
INTRAVENOUS | Status: DC | PRN
  Administered 2014-06-29: 30 mg via INTRAVENOUS

## 2014-06-29 MED ORDER — SODIUM CHLORIDE 0.9 % IV BOLUS (SEPSIS)
1000.0000 mL | Freq: Once | INTRAVENOUS | Status: AC
  Administered 2014-06-29: 1000 mL via INTRAVENOUS

## 2014-06-29 MED ORDER — DILTIAZEM HCL 100 MG IV SOLR
5.0000 mg/h | INTRAVENOUS | Status: DC
  Administered 2014-06-29: 5 mg/h via INTRAVENOUS

## 2014-06-29 MED ORDER — PANTOPRAZOLE SODIUM 40 MG IV SOLR
8.0000 mg/h | INTRAVENOUS | Status: DC
  Administered 2014-06-29: 8 mg/h via INTRAVENOUS
  Filled 2014-06-29 (×2): qty 80

## 2014-06-29 MED ORDER — SODIUM CHLORIDE 0.9 % IV SOLN
8.0000 mg/h | INTRAVENOUS | Status: DC
  Administered 2014-06-29: 8 mg/h via INTRAVENOUS
  Filled 2014-06-29 (×6): qty 80

## 2014-06-29 MED ORDER — PANTOPRAZOLE SODIUM 40 MG IV SOLR
40.0000 mg | Freq: Two times a day (BID) | INTRAVENOUS | Status: DC
Start: 2014-07-02 — End: 2014-06-30

## 2014-06-29 MED ORDER — LACTATED RINGERS IV SOLN
INTRAVENOUS | Status: DC
  Administered 2014-06-29: 12:00:00 via INTRAVENOUS

## 2014-06-29 MED ORDER — ONDANSETRON HCL 4 MG/2ML IJ SOLN
4.0000 mg | Freq: Four times a day (QID) | INTRAMUSCULAR | Status: DC | PRN
  Administered 2014-06-29 (×2): 4 mg via INTRAVENOUS
  Filled 2014-06-29 (×2): qty 2

## 2014-06-29 MED ORDER — HYDROMORPHONE HCL 1 MG/ML IJ SOLN
INTRAMUSCULAR | Status: AC
Start: 2014-06-29 — End: 2014-06-29
  Filled 2014-06-29: qty 1

## 2014-06-29 MED ORDER — SODIUM CHLORIDE 0.9 % IV BOLUS (SEPSIS)
500.0000 mL | Freq: Once | INTRAVENOUS | Status: AC
  Administered 2014-06-29: 500 mL via INTRAVENOUS

## 2014-06-29 MED ORDER — IOHEXOL 300 MG/ML  SOLN
25.0000 mL | INTRAMUSCULAR | Status: AC
  Administered 2014-06-29: 25 mL via ORAL

## 2014-06-29 MED ORDER — BUTAMBEN-TETRACAINE-BENZOCAINE 2-2-14 % EX AERO
INHALATION_SPRAY | CUTANEOUS | Status: DC | PRN
  Administered 2014-06-29: 2 via TOPICAL

## 2014-06-29 MED ORDER — ACETAMINOPHEN 650 MG RE SUPP: 650.0000 mg | Freq: Four times a day (QID) | RECTAL | Status: DC | PRN

## 2014-06-29 MED ORDER — FENTANYL CITRATE 0.05 MG/ML IJ SOLN
50.0000 ug | Freq: Once | INTRAMUSCULAR | Status: AC
  Administered 2014-06-29: 50 ug via INTRAVENOUS
  Filled 2014-06-29: qty 2

## 2014-06-29 MED ORDER — PROPOFOL INFUSION 10 MG/ML OPTIME
INTRAVENOUS | Status: DC | PRN
  Administered 2014-06-29: 100 ug/kg/min via INTRAVENOUS

## 2014-06-29 MED ORDER — LEVALBUTEROL TARTRATE 45 MCG/ACT IN AERO: 2.0000 | INHALATION_SPRAY | Freq: Four times a day (QID) | RESPIRATORY_TRACT | Status: DC

## 2014-06-29 MED ORDER — LEVALBUTEROL HCL 0.63 MG/3ML IN NEBU
0.6300 mg | INHALATION_SOLUTION | Freq: Four times a day (QID) | RESPIRATORY_TRACT | Status: DC
  Filled 2014-06-29 (×7): qty 3

## 2014-06-29 MED ORDER — ALBUTEROL SULFATE (2.5 MG/3ML) 0.083% IN NEBU: 2.5000 mg | INHALATION_SOLUTION | RESPIRATORY_TRACT | Status: DC | PRN

## 2014-06-29 MED ORDER — SODIUM CHLORIDE 0.9 % IV SOLN
Freq: Once | INTRAVENOUS | Status: AC
  Administered 2014-06-29: 04:00:00 via INTRAVENOUS

## 2014-06-29 MED ORDER — ALBUTEROL SULFATE (2.5 MG/3ML) 0.083% IN NEBU
2.5000 mg | INHALATION_SOLUTION | Freq: Four times a day (QID) | RESPIRATORY_TRACT | Status: DC
  Administered 2014-06-29: 2.5 mg via RESPIRATORY_TRACT
  Filled 2014-06-29: qty 3

## 2014-06-29 MED ORDER — SODIUM CHLORIDE 0.9 % IV SOLN
INTRAVENOUS | Status: DC
  Administered 2014-06-29: 09:00:00 via INTRAVENOUS

## 2014-06-29 MED ORDER — PANTOPRAZOLE SODIUM 40 MG IV SOLR
80.0000 mg | Freq: Once | INTRAVENOUS | Status: AC
  Administered 2014-06-29: 80 mg via INTRAVENOUS
  Filled 2014-06-29: qty 80

## 2014-06-29 MED ORDER — FENTANYL CITRATE 0.05 MG/ML IJ SOLN: 25.0000 ug | INTRAMUSCULAR | Status: DC | PRN

## 2014-06-29 MED ORDER — ONDANSETRON HCL 4 MG PO TABS: 4.0000 mg | ORAL_TABLET | Freq: Four times a day (QID) | ORAL | Status: DC | PRN

## 2014-06-29 MED ORDER — ACETAMINOPHEN 325 MG PO TABS: 650.0000 mg | ORAL_TABLET | Freq: Four times a day (QID) | ORAL | Status: DC | PRN

## 2014-06-29 MED ORDER — ONDANSETRON HCL 4 MG/2ML IJ SOLN
4.0000 mg | Freq: Once | INTRAMUSCULAR | Status: AC
  Administered 2014-06-29: 4 mg via INTRAVENOUS
  Filled 2014-06-29: qty 2

## 2014-06-29 MED ORDER — SODIUM CHLORIDE 0.9 % IV SOLN
50.0000 ug/h | INTRAVENOUS | Status: DC
  Administered 2014-06-29: 50 ug/h via INTRAVENOUS
  Filled 2014-06-29 (×5): qty 1

## 2014-06-29 MED ORDER — PANTOPRAZOLE SODIUM 40 MG IV SOLR: 40.0000 mg | Freq: Two times a day (BID) | INTRAVENOUS | Status: DC

## 2014-06-29 MED ORDER — DILTIAZEM HCL 25 MG/5ML IV SOLN
10.0000 mg | Freq: Once | INTRAVENOUS | Status: AC
  Administered 2014-06-29: 10 mg via INTRAVENOUS

## 2014-06-29 MED ORDER — HYDROMORPHONE HCL 1 MG/ML IJ SOLN
0.5000 mg | INTRAMUSCULAR | Status: DC | PRN
  Administered 2014-06-29 (×2): 1 mg via INTRAVENOUS
  Filled 2014-06-29 (×4): qty 1

## 2014-06-29 MED ORDER — PHENYLEPHRINE HCL 10 MG/ML IJ SOLN
INTRAMUSCULAR | Status: DC | PRN
  Administered 2014-06-29 (×2): 80 ug via INTRAVENOUS

## 2014-06-29 MED ORDER — DILTIAZEM LOAD VIA INFUSION
10.0000 mg | Freq: Once | INTRAVENOUS | Status: AC
  Administered 2014-06-29: 10 mg via INTRAVENOUS
  Filled 2014-06-29: qty 10

## 2014-06-29 MED ORDER — SODIUM CHLORIDE 0.9 % IV SOLN: INTRAVENOUS | Status: DC

## 2014-06-29 MED ORDER — IOHEXOL 300 MG/ML  SOLN
100.0000 mL | Freq: Once | INTRAMUSCULAR | Status: AC | PRN
  Administered 2014-06-29: 100 mL via INTRAVENOUS

## 2014-06-29 NOTE — Progress Notes (Signed)
Pt BP 75/42 and 83/49 in another arm HR 89. Notified Donnamarie PoagK. Kirby and ordered to hold Cardizem drip for now and to call if pt goes back into A-fib or HR gets above 100.  Will continue to monitor

## 2014-06-29 NOTE — ED Provider Notes (Signed)
CSN: [id number]     Arrival date & time 07/14/2014  0201 History  This chart was scribed for Shon Batonourtney F Horton, MD by Tanda RockersMargaux Venter, ED Scribe. This patient was seen in room A06C/A06C and the patient's care was started at 2:14 AM.    Chief Complaint  Patient presents with  . Hemoptysis   The history is provided by the patient. No language interpreter was used.     HPI Comments: [name] is a 69 y.o. male brought in by ambulance, with hx COPD , CAD who presents to the Emergency Department complaining of hematemesis that began earlier tonight. Paramedics came out tonight after pt called for feeling "sick." Pt began vomiting blood shortly after paramedics arrived. He also mentions hematochezia tonight with foul odor. Pt took Imodium for his diarrhea prior to other symptoms occuring. He complains of neck pain that began 2 days ago but has been seen at urgent care for it. He denies excessive Ibuprofen or Advil use. He does admit to smoking cigarettes and history of COPD. Pt denies similar symptoms in the past.   Of note, on EMS arrival, patient noted to be tachycardic. He was assessed and had wheezing and was given albuterol as well as steroids. Hematemesis developed in route. Patient also reports episode of bowel incontinence. Past Medical History  Diagnosis Date  . Arthropathy, unspecified, site unspecified   . Old myocardial infarction   . Other premature beats   . Chronic airway obstruction, not elsewhere classified   . Other and unspecified hyperlipidemia   . Essential hypertension, benign   . Coronary atherosclerosis of native coronary artery     DR. PAULA ROSS IS PT'S CARDIOLOGIST  . GERD (gastroesophageal reflux disease)     PT STATES MINOR HEARTBURN -WOULD TAKE MAALOX OR OTHER OVER THE COUNTER MED  . Arthritis     IN HANDS  . Ventral hernias - periumbilical, s/p lap repair w mesh 03/13/2013 09/21/2011   Past Surgical History  Procedure Laterality Date  . Appendectomy    .  Ptca  2008  . Coronary angioplasty    . Ventral hernia repair N/A 03/13/2013    Procedure: LAPAROSCOPIC VENTRAL WALL  HERNIA;  Surgeon: Ardeth SportsmanSteven C. Gross, MD;  Location: [facility];  Service: General;  Laterality: N/A;  . Insertion of mesh N/A 03/13/2013    Procedure: INSERTION OF MESH;  Surgeon: Ardeth SportsmanSteven C. Gross, MD;  Location: [facility];  Service: General;  Laterality: N/A;  . Hernia repair     Family History  Problem Relation Age of Onset  . Heart disease Mother 4070    CABG   History  Substance Use Topics  . Smoking status: Former Smoker -- 2.00 packs/day for 40 years    Types: Cigarettes, Pipe, Cigars    Quit date: 03/27/2006  . Smokeless tobacco: Never Used  . Alcohol Use: No    Review of Systems  Constitutional: Negative.  Negative for fever.  Respiratory: Positive for shortness of breath. Negative for chest tightness.   Cardiovascular: Negative.  Negative for chest pain.  Gastrointestinal: Positive for nausea, vomiting and diarrhea. Negative for abdominal pain.       Hematemesis, melena  Genitourinary: Negative.  Negative for dysuria.  Musculoskeletal: Positive for neck pain. Negative for back pain.  Skin: Negative for rash.  Neurological: Negative for headaches.  All other systems reviewed and are negative.     Allergies  Atrovent  Home Medications   Prior to Admission medications   Medication Sig Start  Date End Date Taking? Authorizing Provider  albuterol (PROAIR HFA) 108 (90 BASE) MCG/ACT inhaler USE 1 TO 2 PUFFS BY MOUTH EVERY 6 HOURS AS NEEDED FOR WHEEZING OR SHORTNESS OF BREATH 04/03/14  Yes Tammy S Parrett, NP  amLODipine (NORVASC) 5 MG tablet Take 1 tablet (5 mg total) by mouth daily with breakfast. 12/05/13  Yes Pricilla Riffle, MD  aspirin 81 MG tablet Take 81 mg by mouth daily.    Yes Historical Provider, MD  Fluticasone-Salmeterol (ADVAIR DISKUS) 250-50 MCG/DOSE AEPB Inhale 1 puff into the lungs 2 (two) times daily. 04/27/14  Yes Oretha Milch, MD  guaiFENesin  (MUCINEX) 600 MG 12 hr tablet Take 600 mg by mouth 2 (two) times daily as needed for cough.    Yes Historical Provider, MD  ibuprofen (ADVIL,MOTRIN) 200 MG tablet Take 200 mg by mouth every 6 (six) hours as needed for mild pain.    Yes Historical Provider, MD  losartan (COZAAR) 50 MG tablet Take 1 tablet (50 mg total) by mouth daily with breakfast. 12/05/13  Yes Pricilla Riffle, MD  metoprolol tartrate (LOPRESSOR) 25 MG tablet TAKE 1 TABLET (25 MG TOTAL) BY MOUTH 2 (TWO) TIMES DAILY. 12/05/13  Yes Pricilla Riffle, MD  simvastatin (ZOCOR) 40 MG tablet Take 1 tablet (40 mg total) by mouth daily at 6 PM. Takes at bedtime 12/05/13  Yes Pricilla Riffle, MD  docusate sodium (COLACE) 100 MG capsule Take 100 mg by mouth daily as needed.     Historical Provider, MD   Triage Vitals: BP 113/55 mmHg  Pulse 105  Resp 20  SpO2 97%   Physical Exam  Constitutional: He is oriented to person, place, and time. No distress.  HENT:  Head: Normocephalic and atraumatic.  Eyes: Pupils are equal, round, and reactive to light.  Neck: Neck supple.  Cardiovascular: Regular rhythm and normal heart sounds.   No murmur heard. Tachycardia  Pulmonary/Chest: Effort normal. He has wheezes.  Diffuse expiratory wheezing, fair air movement  Abdominal: Soft. Bowel sounds are normal. He exhibits no mass. There is no tenderness. There is no rebound.  Genitourinary: Rectum normal.  Gross melena on exam  Musculoskeletal: He exhibits no edema.  Neurological: He is alert and oriented to person, place, and time.  Skin: Skin is warm. He is diaphoretic.  Psychiatric: He has a normal mood and affect.  Nursing note and vitals reviewed.   ED Course  Procedures (including critical care time) CRITICAL CARE Performed by: Shon Baton   Total critical care time: 35 min  Critical care time was exclusive of separately billable procedures and treating other patients.  Critical care was necessary to treat or prevent imminent or  life-threatening deterioration.  Critical care was time spent personally by me on the following activities: development of treatment plan with patient and/or surrogate as well as nursing, discussions with consultants, evaluation of patient's response to treatment, examination of patient, obtaining history from patient or surrogate, ordering and performing treatments and interventions, ordering and review of laboratory studies, ordering and review of radiographic studies, pulse oximetry and re-evaluation of patient's condition.  DIAGNOSTIC STUDIES: Oxygen Saturation is 97% on RA, normal by my interpretation.    COORDINATION OF CARE: 2:21 AM-Discussed treatment plan which includes CBC, CMP, POC occult blood with pt at bedside and pt agreed to plan.   Labs Review Labs Reviewed  CBC WITH DIFFERENTIAL/PLATELET - Abnormal; Notable for the following:    WBC 23.3 (*)    RBC 3.62 (*)  Hemoglobin 11.1 (*)    HCT 32.8 (*)    Monocytes Relative 13 (*)    Neutro Abs 11.4 (*)    Lymphs Abs 8.9 (*)    Monocytes Absolute 3.0 (*)    All other components within normal limits  COMPREHENSIVE METABOLIC PANEL - Abnormal; Notable for the following:    Glucose, Bld 208 (*)    BUN 46 (*)    Total Protein 4.7 (*)    Albumin 2.5 (*)    AST 40 (*)    GFR calc non Af Amer 56 (*)    GFR calc Af Amer 65 (*)    All other components within normal limits  TROPONIN I - Abnormal; Notable for the following:    Troponin I 0.04 (*)    All other components within normal limits  POC OCCULT BLOOD, ED - Abnormal; Notable for the following:    Fecal Occult Bld POSITIVE (*)    All other components within normal limits  I-STAT TROPOININ, ED  TYPE AND SCREEN    Imaging Review Dg Chest Portable 1 View  July 01, 2014   CLINICAL DATA:  Hemoptysis.  Shortness of breath  EXAM: PORTABLE CHEST - 1 VIEW  COMPARISON:  08/01/2012  FINDINGS: Hyperinflation and apical lucency. There is no edema, consolidation, effusion, or  pneumothorax. Normal heart size and aortic contours. No gross source for hemoptysis. No gross alveolar hemorrhage.  IMPRESSION: COPD without acute superimposed disease.   Electronically Signed   By: Marnee Spring M.D.   On: 07-01-2014 02:57     EKG Interpretation   Date/Time:  Monday 07/01/14 02:22:47 EDT Ventricular Rate:  116 PR Interval:  112 QRS Duration: 91 QT Interval:  325 QTC Calculation: 451 R Axis:   76 Text Interpretation:  Sinus tachycardia Multiple ventricular premature  complexes ST depression V1-V3 Confirmed by HORTON  MD, COURTNEY ([id number]) on  July 01, 2014 2:59:45 AM      MDM   Final diagnoses:  Upper GI bleed   Patient presents with hematemesis. Tachycardic on exam. Blood pressure stable. Also with wheezing which is likely related to patient's COPD. Patient type and screen. 2 large-bore IVs established. Patient given normal saline bolus. Patient bolused IV Protonix and placed on a Protonix infusion.  Hemoglobin down trending from baseline of 13.5 to 11.  BUN elevated likely secondary to acute GI bleed.  Lab troponin is mildly elevated at 0.04. Patient is not having any active chest pain and in the setting of acute GI bleed, will monitor and trend enzymes. Discussed with Dr. Randa Evens on call for GI. He will plan to see the patient. Discussed with Dr. Lovell Sheehan for admit to the stepdown unit given ongoing upper GI bleeding.  I personally performed the services described in this documentation, which was scribed in my presence. The recorded information has been reviewed and is accurate.      Shon Baton, MD 2014-07-01 [phone]

## 2014-06-29 NOTE — ED Notes (Signed)
PATIENT ARRIVED FROM ENDOSCOPY TO CONTINUE HOLDING IN THE EMERGENCY DEPARTMENT FOR A STEP DOWN BED. HE COMPLAINS OF PAIN IN HIS RIGHT SHOULDER AND APPEARS ANXIOUS. ON MONITOR HAVE A NARROW COMPLEX TACHYCARDIA RATE 170'S. DR RAY WAS NOTIFIED AND TO THE BEDSIDE. EKG WAS COMPLETED. OXYGEN APPLIED AND MEDICATIONS GIVEN AS VERBALLY ORDERED BY DR RAY. ADMITTING HOSPITALIST DR Roda ShuttersXU WAS MADE AWARE BY Lequita HaltMORGAN AND SHE ADVISES HE GAVE NO NEW ORDERS. PT GIVEN SUPPORT AND EXPLANATION OF CARE BEING GIVEN.

## 2014-06-29 NOTE — ED Notes (Addendum)
Dr. Marjorie SmolderBucini called back stating that patient needs to remain NPO but that ice chips and small sips of water occasionally should be okay if they are tolerated. Dr. Roda ShuttersXu updated on status of patient's diet.

## 2014-06-29 NOTE — Anesthesia Postprocedure Evaluation (Signed)
  Anesthesia Post-op Note  Patient: [name]  Procedure(s) Performed: Procedure(s): ESOPHAGOGASTRODUODENOSCOPY (EGD) WITH PROPOFOL (N/A)  Patient Location: Endoscopy Unit  Anesthesia Type:MAC  Level of Consciousness: awake  Airway and Oxygen Therapy: Patient Spontanous Breathing  Post-op Pain: none  Post-op Assessment: Post-op Vital signs reviewed, Patient's Cardiovascular Status Stable, Respiratory Function Stable, Patent Airway, No signs of Nausea or vomiting and Pain level controlled  Post-op Vital Signs: Reviewed and stable  Last Vitals:  Filed Vitals:   07/17/2014 1330  BP: 180/38  Pulse: 111  Temp:   Resp: 16    Complications: No apparent anesthesia complications

## 2014-06-29 NOTE — Op Note (Signed)
[facility] [address] [place], [id number]   ENDOSCOPY PROCEDURE REPORT  PATIENT: [name], [name]  MR#: [id number] BIRTHDATE: [birth year] , 68  yrs. old GENDER: male ENDOSCOPIST: Wandalee FerdinandSam Ganem, MD REFERRED BY: PROCEDURE DATE:  07/16/2014 PROCEDURE:  EGD ASA CLASS:     3 INDICATIONS:  hematemesis MEDICATIONS: propofol per anesthesia TOPICAL ANESTHETIC:  DESCRIPTION OF PROCEDURE: After the risks benefits and alternatives of the procedure were thoroughly explained, informed consent was obtained.  The [employer]COP[employer] [id number] endoscope was introduced through the mouth and advanced to the second portion of the duodenum , Without limitations.  The instrument was slowly withdrawn as the mucosa was fully examined.  Findings:  Esophagus: There was some old scattered blood in the esophagus. No varices were seen or sites of bleeding.  Stomach: Upon entering the stomach there was a large amount of blood clot on the dependent portion of the stomach. Some of this blood was able to be suctioned. I could not however see under the clot on the dependent portion of the stomach on the greater curvature. On retroflexion however and also which could be noted on forward view there was a bulging area of mucosa in the region of the fundus and cardia of the stomach as seen on image 7, 8, and 10. 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 it's also possible that this could be a focal ulcer with the bulging area that we see being from submucosal hemorrhage, although it had a varix-like appearance to it which was concerning. On the tip of this bulging area was a raised whitish area which could be an ulcerated area and the site of the bleeding, although it was not bleeding now. Because I thought this could be a gastric varix I did not do anything therapeutically such as inject or clip for fear of causing massive bleeding no other lesions were seen in the  stomach.   Duodenum: There was a shallow linear ulceration in the bulb.     The scope was then withdrawn from the patient and the procedure completed.  COMPLICATIONS: There were no immediate complications.  ENDOSCOPIC IMPRESSION:see above   RECOMMENDATIONS: because of my concern that this area seen in the fundus and cardia region of the stomach could be a gastric varix I will start him on octreotide. I will get a CT scan of the abdomen to see if we can see any evidence of liver disease or varices or other abnormalities in the fundus and cardia of the stomach region.   REPEAT EXAM:  eSignedWandalee Ferdinand:  Sam Ganem, MD 07/12/2014 1:05 PM    CC:  CPT CODES: ICD CODES:  The ICD and CPT codes recommended by this software are interpretations from the data that the clinical staff has captured with the software.  The verification of the translation of this report to the ICD and CPT codes and modifiers is the sole responsibility of the health care institution and practicing physician where this report was generated.  [employer] will not be held responsible for the validity of the ICD and CPT codes included on this report.  AMA assumes no liability for data contained or not contained herein. CPT is a Publishing rights managerregistered trademark of the [employer].  PATIENT NAME:  [name], [name] MR#: [id number]

## 2014-06-29 NOTE — ED Notes (Signed)
Patient denies pain and is resting comfortably.  

## 2014-06-29 NOTE — ED Notes (Signed)
Dr. Marjorie SmolderBucini who is covering for Dr. Beverely RisenGanam returned phone call regarding pts diet. States he will try to get in touch with Dr. Beverely RisenGanam and find out if patient needs to remain NPO or if he can have clear liquids.

## 2014-06-29 NOTE — Transfer of Care (Signed)
Immediate Anesthesia Transfer of Care Note  Patient: [name]  Procedure(s) Performed: Procedure(s): ESOPHAGOGASTRODUODENOSCOPY (EGD) WITH PROPOFOL (N/A)  Patient Location: Endoscopy Unit  Anesthesia Type:MAC  Level of Consciousness: sedated  Airway & Oxygen Therapy: Patient Spontanous Breathing and Patient connected to nasal cannula oxygen  Post-op Assessment: Report given to RN and Post -op Vital signs reviewed and stable  Post vital signs: Reviewed and stable  Last Vitals:  Filed Vitals:   07/17/2014 1137  BP: 136/65  Pulse: 121  Temp: 36.4 C  Resp:     Complications: No apparent anesthesia complications

## 2014-06-29 NOTE — ED Notes (Signed)
Per EMS - pt coughing up bright red blood. [facility], dx with herniated disc. Pt hx irregular heart beat - he thinks a fib. EMS heard tight wheezing in all 4 lobes, given 2 albuterol tx and 1 tx for atrovent started, given 125 solumedrol. Pt sense of impending doom while en route to ED. Pt c/o new bowel/bladder incontinence, worsening shortness of breath and diaphoresis. Pt bp 130/106, 130bpm.

## 2014-06-29 NOTE — ED Notes (Signed)
Dr. Roda ShuttersXu at bedside, asked this nurse to page Dr. Beverely RisenGanam to see if patient needs to remain NPO. Dr. Roda ShuttersXu requested for this nurse to page her when patient has a room available. Dr. Beverely RisenGanam paged in regards to patient's diet.

## 2014-06-29 NOTE — Anesthesia Procedure Notes (Signed)
Procedure Name: MAC Date/Time: 06/29/2014 12:25 PM Performed by: Orvilla FusATO, Jermichael Belmares A Oxygen Delivery Method: Nasal cannula Placement Confirmation: positive ETCO2

## 2014-06-29 NOTE — H&P (Addendum)
[facility] Admission History and Physical       [name] WUJ:[id number] DOB: 1945/11/19 DOA: 07/04/2014  Referring physician:  EDP PCP: Johny Blamer, MD  Specialists:   Chief Complaint: Vomiting Blood  HPI: [name] is a 69 y.o. male with a history of COPD, CAD, HTN, Hyperlipidemia who was having SOB and felt nauseous so he called EMS, and they came to his house, and he did not got to the ED, but as soon as EMS left he began to have hematemesis of Bright Red Blood.  EMS was called back and he was brought to the ED.  Earlier in the evening he passed a dark stool.   In the ED an FOBT was performed which was HEME positive.  His initial hemoglobin level was 11.1.  He reports that he has had severe neck pain radiating into his Left arm x 1 week and has been taking Ibuprofen twice daily without relief.  He reports that he went to an area [facility] and was told he had a pinched nerve,  and was prescribed Hydrocodone/APAP 5/325 which he reports did not relieve the pain either.  He denies any trauma.       Review of Systems:  Constitutional: No Weight Loss, No Weight Gain, Night Sweats, Fevers, Chills, Dizziness, Light Headedness, +Fatigue, +Generalized Weakness HEENT: No Headaches, Difficulty Swallowing,Tooth/Dental Problems,Sore Throat,  No Sneezing, Rhinitis, Ear Ache, Nasal Congestion, or Post Nasal Drip,  Cardio-vascular:  No Chest pain, Orthopnea, PND, Edema in Lower Extremities, Anasarca, Dizziness, Palpitations  Resp: +Dyspnea, No DOE, No Cough, No Hemoptysis, No Wheezing.    GI: No Heartburn, Indigestion, Abdominal Pain, +Nausea, Vomiting, Diarrhea, Constipation,+ Hematemesis, +Hematochezia, +Melena, Change in Bowel Habits,  Loss ofAppetite  GU: No Dysuria, No Change in Color of Urine, No Urgency or Urinary Frequency, No Flank pain.  Musculoskeletal: + Neck Pain, No Decreased Range of Motion, No Back Pain.  Neurologic: No Syncope, No Seizures, Muscle Weakness,  Paresthesia, Vision Disturbance or Loss, No Diplopia, No Vertigo, No Difficulty Walking,  Skin: No Rash or Lesions. Psych: No Change in Mood or Affect, No Depression or Anxiety, No Memory loss, No Confusion, or Hallucinations   Past Medical History  Diagnosis Date  . Arthropathy, unspecified, site unspecified   . Old myocardial infarction   . Other premature beats   . Chronic airway obstruction, not elsewhere classified   . Other and unspecified hyperlipidemia   . Essential hypertension, benign   . Coronary atherosclerosis of native coronary artery     DR. PAULA ROSS IS PT'S CARDIOLOGIST  . GERD (gastroesophageal reflux disease)     PT STATES MINOR HEARTBURN -WOULD TAKE MAALOX OR OTHER OVER THE COUNTER MED  . Arthritis     IN HANDS  . Ventral hernias - periumbilical, s/p lap repair w mesh 03/13/2013 09/21/2011     Past Surgical History  Procedure Laterality Date  . Appendectomy    . Ptca  2008  . Coronary angioplasty    . Ventral hernia repair N/A 03/13/2013    Procedure: LAPAROSCOPIC VENTRAL WALL  HERNIA;  Surgeon: Ardeth Sportsman, MD;  Location: [facility];  Service: General;  Laterality: N/A;  . Insertion of mesh N/A 03/13/2013    Procedure: INSERTION OF MESH;  Surgeon: Ardeth Sportsman, MD;  Location: [facility];  Service: General;  Laterality: N/A;  . Hernia repair        Prior to Admission medications   Medication Sig Start Date End Date Taking? Authorizing Provider  albuterol (PROAIR HFA) 108 (90 BASE) MCG/ACT inhaler USE 1 TO 2 PUFFS BY MOUTH EVERY 6 HOURS AS NEEDED FOR WHEEZING OR SHORTNESS OF BREATH 04/03/14  Yes Tammy S Parrett, NP  amLODipine (NORVASC) 5 MG tablet Take 1 tablet (5 mg total) by mouth daily with breakfast. 12/05/13  Yes Pricilla Riffle, MD  aspirin 81 MG tablet Take 81 mg by mouth daily.    Yes Historical Provider, MD  Fluticasone-Salmeterol (ADVAIR DISKUS) 250-50 MCG/DOSE AEPB Inhale 1 puff into the lungs 2 (two) times daily. 04/27/14  Yes Oretha Milch, MD    guaiFENesin (MUCINEX) 600 MG 12 hr tablet Take 600 mg by mouth 2 (two) times daily as needed for cough.    Yes Historical Provider, MD  ibuprofen (ADVIL,MOTRIN) 200 MG tablet Take 200 mg by mouth every 6 (six) hours as needed for mild pain.    Yes Historical Provider, MD  losartan (COZAAR) 50 MG tablet Take 1 tablet (50 mg total) by mouth daily with breakfast. 12/05/13  Yes Pricilla Riffle, MD  metoprolol tartrate (LOPRESSOR) 25 MG tablet TAKE 1 TABLET (25 MG TOTAL) BY MOUTH 2 (TWO) TIMES DAILY. 12/05/13  Yes Pricilla Riffle, MD  simvastatin (ZOCOR) 40 MG tablet Take 1 tablet (40 mg total) by mouth daily at 6 PM. Takes at bedtime 12/05/13  Yes Pricilla Riffle, MD  docusate sodium (COLACE) 100 MG capsule Take 100 mg by mouth daily as needed.     Historical Provider, MD     Allergies  Allergen Reactions  . Atrovent [Ipratropium]     REACTION: lips swelling    Social History:  reports that he quit smoking about 8 years ago. His smoking use included Cigarettes, Pipe, and Cigars. He has a 80 pack-year smoking history. He has never used smokeless tobacco. He reports that he does not drink alcohol or use illicit drugs.    Family History  Problem Relation Age of Onset  . Heart disease Mother 99    CABG       Physical Exam:  GEN:  Pleasant Elderly  69 y.o. Caucasian male examined and in no acute distress; cooperative with exam Filed Vitals:   07/23/2014 0319 07/03/2014 0330 07/20/2014 0345 07/16/2014 0400  BP: 135/43 100/43 102/44 111/58  Pulse: 107 103 100 103  Resp: 20     SpO2: 95% 94% 93% 93%   Blood pressure 111/58, pulse 103, resp. rate 20, SpO2 93 %. PSYCH: He is alert and oriented x4; does not appear anxious does not appear depressed; affect is normal HEENT: Normocephalic and Atraumatic, Mucous membranes pink; PERRLA; EOM intact; Fundi:  Benign;  No scleral icterus, Nares: Patent, Oropharynx: Clear, Edentulous,    Neck:  FROM, No Cervical Lymphadenopathy nor Thyromegaly or Carotid Bruit; No  JVD; Breasts:: Not examined CHEST WALL: No tenderness CHEST: Normal respiration, clear to auscultation bilaterally HEART: Regular rate and rhythm; no murmurs rubs or gallops BACK: No kyphosis or scoliosis; No CVA tenderness ABDOMEN: Positive Bowel Sounds, Soft Non-Tender, No Rebound or Guarding; No Masses, No Organomegaly. Rectal Exam: Not done EXTREMITIES: No Cyanosis, Clubbing, or Edema; No Ulcerations. Genitalia: not examined PULSES: 2+ and symmetric SKIN: Normal hydration no rash or ulceration CNS:  Alert and Oriented x 4, No Focal Deficits Vascular: pulses palpable throughout    Labs on Admission:  Basic Metabolic Panel:  Recent Labs Lab 07/20/2014 0215  NA 139  K 4.2  CL 106  CO2 23  GLUCOSE 208*  BUN 46*  CREATININE 1.27  CALCIUM 9.0   Liver Function Tests:  Recent Labs Lab 07-16-14 0215  AST 40*  ALT 24  ALKPHOS 66  BILITOT 0.5  PROT 4.7*  ALBUMIN 2.5*   No results for input(s): LIPASE, AMYLASE in the last 168 hours. No results for input(s): AMMONIA in the last 168 hours. CBC:  Recent Labs Lab 07-16-14 0215  WBC 23.3*  NEUTROABS 11.4*  HGB 11.1*  HCT 32.8*  MCV 90.6  PLT 219   Cardiac Enzymes:  Recent Labs Lab 07-16-14 0215  TROPONINI 0.04*    BNP (last 3 results) No results for input(s): BNP in the last 8760 hours.  ProBNP (last 3 results) No results for input(s): PROBNP in the last 8760 hours.  CBG: No results for input(s): GLUCAP in the last 168 hours.  Radiological Exams on Admission: Dg Chest Portable 1 View  07/15/2014   CLINICAL DATA:  Hemoptysis.  Shortness of breath  EXAM: PORTABLE CHEST - 1 VIEW  COMPARISON:  08/01/2012  FINDINGS: Hyperinflation and apical lucency. There is no edema, consolidation, effusion, or pneumothorax. Normal heart size and aortic contours. No gross source for hemoptysis. No gross alveolar hemorrhage.  IMPRESSION: COPD without acute superimposed disease.   Electronically Signed   By: Marnee SpringJonathon  Watts M.D.    On: Jan 12, 2015 02:57     EKG: Independently reviewed. Sinus Tachycardia  Rate =116, + PVCs, +S-T depression in Anterior Leads   Assessment/Plan:   69 y.o. male with  Principal Problem:   1.   Upper GI bleed- Most Likely due to NSAIDs ( Ibuprofen)   SDU Monitoring   IV Protonix Drip   Monitor H/Hs   Transfuse PRN   Hold ASA, Discontinue NSAIDs   NPO   IVFs   GI Consult[facility] to see this AM      Active Problems:   2.   Anemia due to acute blood loss- due to #1   Monitor H/Hs   Transfuse PRN     3.   COPD (chronic obstructive pulmonary disease)   Albuterol Nebs PRN   O2 PRN     4.   Cervical disc disease with myelopathy   IV Fentanyl PRN Pain while     BPs mildy Low   MRI of C-spine when stable      5.   HYPERTENSION, BENIGN   Hold Amlodipine and Metoprolol Rx        6.   CAD, NATIVE VESSEL   On Metoprolol, Losartan, and Amlodipine Rx     7.   Hyperlipidemia   Resume Simvastatin Rx when taking PO        8.   DVT Prophylaxis    SCDs        Code Status:     FULL CODE       Family Communication:   Son at Bedside   Disposition Plan:    Inpatient  Status        Time spent:  4460 Minutes      Ron ParkerJENKINS,Heli Dino C [facility] Pager [phone]   If 7AM -7PM Please Contact the Day Rounding Team MD for [facility]  If 7PM-7AM, Please Contact Night-Floor Coverage  [email or web address] Password [email or web address] 07/08/2014, 4:33 AM     ADDENDUM:   Patient was seen and examined on 07/13/2014

## 2014-06-29 NOTE — ED Notes (Signed)
Dr. Wilkie AyeHorton aware of pts situation, in room at this time

## 2014-06-29 NOTE — ED Notes (Signed)
Dr. Evette CristalGanem, Gi was at the bedside discussing plan of care.  Pt. To have his endoscopy this afternoon.  Pt. To be NPO beginning now.

## 2014-06-29 NOTE — ED Notes (Signed)
Patient was given ice chips. 

## 2014-06-29 NOTE — Anesthesia Preprocedure Evaluation (Signed)
Anesthesia Evaluation  Patient identified by MRN, date of birth, ID band Patient awake    Reviewed: Allergy & Precautions, NPO status , Patient's Chart, lab work & pertinent test results  History of Anesthesia Complications Negative for: history of anesthetic complications  Airway Mallampati: II  TM Distance: >3 FB Neck ROM: Full    Dental  (+) Edentulous Upper, Edentulous Lower   Pulmonary shortness of breath, neg sleep apnea, COPD COPD inhaler, former smoker,  breath sounds clear to auscultation        Cardiovascular hypertension, Pt. on medications and Pt. on home beta blockers + CAD, + Past MI and + Cardiac Stents - CHF - dysrhythmias Rhythm:Regular Rate:Tachycardia     Neuro/Psych negative neurological ROS  negative psych ROS   GI/Hepatic Neg liver ROS, GERD-  ,hematemesis   Endo/Other    Renal/GU Renal InsufficiencyRenal disease     Musculoskeletal  (+) Arthritis -,   Abdominal   Peds  Hematology  (+) anemia ,   Anesthesia Other Findings   Reproductive/Obstetrics                             Anesthesia Physical Anesthesia Plan  ASA: III  Anesthesia Plan: MAC   Post-op Pain Management:    Induction: Intravenous  Airway Management Planned: Simple Face Mask  Additional Equipment: None  Intra-op Plan:   Post-operative Plan:   Informed Consent: I have reviewed the patients History and Physical, chart, labs and discussed the procedure including the risks, benefits and alternatives for the proposed anesthesia with the patient or authorized representative who has indicated his/her understanding and acceptance.   Dental advisory given  Plan Discussed with: CRNA and Surgeon  Anesthesia Plan Comments:         Anesthesia Quick Evaluation

## 2014-06-29 NOTE — ED Notes (Signed)
Spoke with dr Roda Shuttersxu, admitting physician regarding heart rate.

## 2014-06-29 NOTE — Progress Notes (Addendum)
Patient seen and examined, continued to have tarry stool, c/o right shoulder/upper back pain, wanting to eat, tachycardia, wheezing. No edema, ab notender. ekg sinus tachy with frequent pac's vs afib, on cardizem drip. On octreotide drip/ppi/ivf. xopenex ordered for wheezing, allergic to atrovent.cxr no pna. Monitor h/h/cardiac enzyme/echo. To stepdown unit.

## 2014-06-29 NOTE — Consult Note (Signed)
Subjective:   HPI  The patient is a 69 year old male who presented to the emergency room with hematemesis which began yesterday. He vomited bright red blood. He had been taking NSAIDs for neck pain over the last few days. He denies abdominal pain. He denies ever having had an ulcer. He denies excessive use of alcohol.  Review of Systems Denies anginal chest pains. He does have some shortness of breath but does have a history of COPD.  Past Medical History  Diagnosis Date  . Arthropathy, unspecified, site unspecified   . Old myocardial infarction   . Other premature beats   . Chronic airway obstruction, not elsewhere classified   . Other and unspecified hyperlipidemia   . Essential hypertension, benign   . Coronary atherosclerosis of native coronary artery     DR. PAULA ROSS IS PT'S CARDIOLOGIST  . GERD (gastroesophageal reflux disease)     PT STATES MINOR HEARTBURN -WOULD TAKE MAALOX OR OTHER OVER THE COUNTER MED  . Arthritis     IN HANDS  . Ventral hernias - periumbilical, s/p lap repair w mesh 03/13/2013 09/21/2011   Past Surgical History  Procedure Laterality Date  . Appendectomy    . Ptca  2008  . Coronary angioplasty    . Ventral hernia repair N/A 03/13/2013    Procedure: LAPAROSCOPIC VENTRAL WALL  HERNIA;  Surgeon: Ardeth SportsmanSteven C. Gross, MD;  Location: [facility];  Service: General;  Laterality: N/A;  . Insertion of mesh N/A 03/13/2013    Procedure: INSERTION OF MESH;  Surgeon: Ardeth SportsmanSteven C. Gross, MD;  Location: [facility];  Service: General;  Laterality: N/A;  . Hernia repair     History   Social History  . Marital Status: Divorced    Spouse Name: N/A  . Number of Children: N/A  . Years of Education: N/A   Occupational History  . 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 Smoking status: Former Smoker -- 2.00 packs/day for 40 years    Types: Cigarettes, Pipe, Cigars    Quit date: 03/27/2006  . Smokeless tobacco: Never Used  . Alcohol  Use: No  . Drug Use: No  . Sexual Activity: Not on file   Other Topics Concern  . Not on file   Social History Narrative   family history includes Heart disease (age of onset: 9070) in his mother.  Current facility-administered medications:  .  0.9 %  sodium chloride infusion, , Intravenous, Continuous, Ron ParkerHarvette C Jenkins, MD, Last Rate: 100 mL/hr at 06/28/2014 0912 .  acetaminophen (TYLENOL) tablet 650 mg, 650 mg, Oral, Q6H PRN **OR** acetaminophen (TYLENOL) suppository 650 mg, 650 mg, Rectal, Q6H PRN, Ron ParkerHarvette C Jenkins, MD .  albuterol (PROVENTIL) (2.5 MG/3ML) 0.083% nebulizer solution 2.5 mg, 2.5 mg, Nebulization, Q2H PRN, Ron ParkerHarvette C Jenkins, MD .  alum & mag hydroxide-simeth (MAALOX/MYLANTA) 200-200-20 MG/5ML suspension 30 mL, 30 mL, Oral, Q6H PRN, Ron ParkerHarvette C Jenkins, MD .  HYDROmorphone (DILAUDID) injection 0.5-1 mg, 0.5-1 mg, Intravenous, Q3H PRN, Ron ParkerHarvette C Jenkins, MD, 1 mg at 07/23/2014 0913 .  ondansetron (ZOFRAN) tablet 4 mg, 4 mg, Oral, Q6H PRN **OR** ondansetron (ZOFRAN) injection 4 mg, 4 mg, Intravenous, Q6H PRN, Ron ParkerHarvette C Jenkins, MD, 4 mg at 07/08/2014 0914 .  pantoprazole (PROTONIX) 80 mg in sodium chloride 0.9 % 250 mL (0.32 mg/mL) infusion, 8 mg/hr, Intravenous, Continuous, Ron ParkerHarvette C Jenkins, MD, Last Rate: 25 mL/hr at 07/09/2014 0547, 8 mg/hr at 07/21/2014 0547 .  [  START ON 07/02/2014] pantoprazole (PROTONIX) injection 40 mg, 40 mg, Intravenous, Q12H, Shon Baton, MD .  Melene Muller ON 07/02/2014] pantoprazole (PROTONIX) injection 40 mg, 40 mg, Intravenous, Q12H, Ron Parker, MD  Current outpatient prescriptions:  .  albuterol (PROAIR HFA) 108 (90 BASE) MCG/ACT inhaler, USE 1 TO 2 PUFFS BY MOUTH EVERY 6 HOURS AS NEEDED FOR WHEEZING OR SHORTNESS OF BREATH, Disp: 8.5 each, Rfl: 5 .  amLODipine (NORVASC) 5 MG tablet, Take 1 tablet (5 mg total) by mouth daily with breakfast., Disp: 90 tablet, Rfl: 3 .  aspirin 81 MG tablet, Take 81 mg by mouth daily. , Disp: , Rfl:  .   Fluticasone-Salmeterol (ADVAIR DISKUS) 250-50 MCG/DOSE AEPB, Inhale 1 puff into the lungs 2 (two) times daily., Disp: 60 each, Rfl: 5 .  guaiFENesin (MUCINEX) 600 MG 12 hr tablet, Take 600 mg by mouth 2 (two) times daily as needed for cough. , Disp: , Rfl:  .  ibuprofen (ADVIL,MOTRIN) 200 MG tablet, Take 200 mg by mouth every 6 (six) hours as needed for mild pain. , Disp: , Rfl:  .  losartan (COZAAR) 50 MG tablet, Take 1 tablet (50 mg total) by mouth daily with breakfast., Disp: 90 tablet, Rfl: 3 .  metoprolol tartrate (LOPRESSOR) 25 MG tablet, TAKE 1 TABLET (25 MG TOTAL) BY MOUTH 2 (TWO) TIMES DAILY., Disp: 180 tablet, Rfl: 3 .  simvastatin (ZOCOR) 40 MG tablet, Take 1 tablet (40 mg total) by mouth daily at 6 PM. Takes at bedtime, Disp: 90 tablet, Rfl: 3 .  docusate sodium (COLACE) 100 MG capsule, Take 100 mg by mouth daily as needed. , Disp: , Rfl:  Allergies  Allergen Reactions  . Atrovent [Ipratropium]     REACTION: lips swelling     Objective:     BP 125/59 mmHg  Pulse 118  Resp 18  SpO2 100%  No acute distress  Nonicteric  Heart regular rhythm no murmurs  Lungs clear  Abdomen: Bowel sounds normal, soft, nontender, no hepatosplenomegaly    Laboratory No components found for: D1    Assessment:     Hematemesis      Plan:     Continue PPI therapy. Proceed with EGD today. Lab Results  Component Value Date   HGB 11.1* 07/24/2014   HGB 14.3 12/05/2013   HGB 15.0 03/13/2013   HCT 32.8* 07/05/2014   HCT 42.9 12/05/2013   HCT 42.3 03/13/2013   ALKPHOS 66 07/19/2014   ALKPHOS 66 01/21/2009   ALKPHOS 67 11/14/2008   AST 40* 06/28/2014   AST 38* 12/05/2013   AST 16 01/21/2009   ALT 24 07/20/2014   ALT 19 01/21/2009   ALT 22 11/14/2008

## 2014-06-29 NOTE — ED Notes (Addendum)
Patient transported to CT with RN to accompany

## 2014-06-29 NOTE — ED Notes (Signed)
MD at the bedside  

## 2014-06-29 NOTE — ED Notes (Signed)
Pt actively vomiting blood, Dr. Wilkie AyeHorton at bedside.

## 2014-06-29 NOTE — ED Provider Notes (Addendum)
69 year old man who was seen and admitted last night. He was taken to the endoscopy suite and upper endoscopy was performed. He returned here to pod see as there is not an inpatient bed available. Upon return he complained of some right-sided chest discomfort and was noted to have a heart rate of 180-190. EKG obtained shows A. fib with RVR at a rate of 172. Patient evaluated and blood pressure 120/90. Cardizem 10 mg given and heart rate has decreased to the and level. Cardizem drip is being hung.  Margarita Grizzleanielle Kierstynn Babich, MD May 30, 2014 1454  Cardizem given and drip with rate decreased to 110's and bp stable.  Hospitalist paged by rn. ED ECG REPORT   Date: Apr 05, 2014  Rate: 172  Rhythm: atrial fibrillation  QRS Axis: right  Intervals:  a fib rvr  ST/T Wave abnormalities: ST depressions diffusely  Conduction Disutrbances:a fib rvr  Narrative Interpretation:   Old EKG Reviewed: none available   Margarita Grizzleanielle Tameshia Bonneville, MD May 30, 2014 [phone]

## 2014-06-30 ENCOUNTER — Inpatient Hospital Stay (HOSPITAL_COMMUNITY): Payer: Medicare Other | Admitting: Certified Registered"

## 2014-06-30 ENCOUNTER — Encounter (HOSPITAL_COMMUNITY): Admission: EM | Disposition: E | Payer: Self-pay | Source: Home / Self Care | Attending: Pulmonary Disease

## 2014-06-30 ENCOUNTER — Inpatient Hospital Stay (HOSPITAL_COMMUNITY): Payer: Medicare Other

## 2014-06-30 ENCOUNTER — Encounter (HOSPITAL_COMMUNITY): Payer: Self-pay | Admitting: Gastroenterology

## 2014-06-30 DIAGNOSIS — J9601 Acute respiratory failure with hypoxia: Secondary | ICD-10-CM

## 2014-06-30 DIAGNOSIS — I469 Cardiac arrest, cause unspecified: Secondary | ICD-10-CM

## 2014-06-30 DIAGNOSIS — K922 Gastrointestinal hemorrhage, unspecified: Secondary | ICD-10-CM | POA: Insufficient documentation

## 2014-06-30 HISTORY — PX: ESOPHAGOGASTRODUODENOSCOPY: SHX5428

## 2014-06-30 LAB — COMPREHENSIVE METABOLIC PANEL
ALT: 52 U/L (ref 0–53)
AST: 85 U/L — AB (ref 0–37)
Albumin: <1 g/dL — ABNORMAL LOW (ref 3.5–5.2)
Alkaline Phosphatase: 47 U/L (ref 39–117)
Anion gap: 10 (ref 5–15)
BILIRUBIN TOTAL: 0.4 mg/dL (ref 0.3–1.2)
BUN: 61 mg/dL — ABNORMAL HIGH (ref 6–23)
CHLORIDE: 115 mmol/L — AB (ref 96–112)
CO2: 18 mmol/L — ABNORMAL LOW (ref 19–32)
Calcium: 6.9 mg/dL — ABNORMAL LOW (ref 8.4–10.5)
Creatinine, Ser: 1.81 mg/dL — ABNORMAL HIGH (ref 0.50–1.35)
GFR calc Af Amer: 42 mL/min — ABNORMAL LOW (ref >90)
GFR calc non Af Amer: 36 mL/min — ABNORMAL LOW (ref >90)
Glucose, Bld: 262 mg/dL — ABNORMAL HIGH (ref 70–99)
Potassium: 3.7 mmol/L (ref 3.5–5.1)
SODIUM: 143 mmol/L (ref 135–145)
Total Protein: <3 g/dL — ABNORMAL LOW (ref 6.0–8.3)

## 2014-06-30 LAB — BASIC METABOLIC PANEL
ANION GAP: 6 (ref 5–15)
Anion gap: 9 (ref 5–15)
BUN: 64 mg/dL — AB (ref 6–23)
BUN: 57 mg/dL — ABNORMAL HIGH (ref 6–23)
CALCIUM: 8.3 mg/dL — AB (ref 8.4–10.5)
CHLORIDE: 120 mmol/L — AB (ref 96–112)
CO2: 20 mmol/L (ref 19–32)
CO2: 22 mmol/L (ref 19–32)
CREATININE: 1.57 mg/dL — AB (ref 0.50–1.35)
CREATININE: 1.69 mg/dL — AB (ref 0.50–1.35)
Calcium: 6.1 mg/dL — CL (ref 8.4–10.5)
Chloride: 112 mmol/L (ref 96–112)
GFR calc Af Amer: 50 mL/min — ABNORMAL LOW (ref >90)
GFR calc Af Amer: 46 mL/min — ABNORMAL LOW (ref >90)
GFR calc non Af Amer: 40 mL/min — ABNORMAL LOW (ref >90)
GFR, EST NON AFRICAN AMERICAN: 43 mL/min — AB (ref >90)
GLUCOSE: 185 mg/dL — AB (ref 70–99)
Glucose, Bld: 171 mg/dL — ABNORMAL HIGH (ref 70–99)
Potassium: 4.4 mmol/L (ref 3.5–5.1)
Potassium: 3.9 mmol/L (ref 3.5–5.1)
Sodium: 141 mmol/L (ref 135–145)
Sodium: 148 mmol/L — ABNORMAL HIGH (ref 135–145)

## 2014-06-30 LAB — CBC
HCT: 22.4 % — ABNORMAL LOW (ref 39.0–52.0)
HEMATOCRIT: 11.7 % — AB (ref 39.0–52.0)
HEMATOCRIT: 38.2 % — AB (ref 39.0–52.0)
HEMATOCRIT: 45.1 % (ref 39.0–52.0)
HEMOGLOBIN: 7.4 g/dL — AB (ref 13.0–17.0)
Hemoglobin: 3.6 g/dL — CL (ref 13.0–17.0)
Hemoglobin: 12.9 g/dL — ABNORMAL LOW (ref 13.0–17.0)
Hemoglobin: 15.4 g/dL (ref 13.0–17.0)
MCH: 30.3 pg (ref 26.0–34.0)
MCH: 29.3 pg (ref 26.0–34.0)
MCH: 30.1 pg (ref 26.0–34.0)
MCH: 29.8 pg (ref 26.0–34.0)
MCHC: 33 g/dL (ref 30.0–36.0)
MCHC: 30.8 g/dL (ref 30.0–36.0)
MCHC: 33.8 g/dL (ref 30.0–36.0)
MCHC: 34.1 g/dL (ref 30.0–36.0)
MCV: 91.8 fL (ref 78.0–100.0)
MCV: 95.1 fL (ref 78.0–100.0)
MCV: 89.3 fL (ref 78.0–100.0)
MCV: 87.2 fL (ref 78.0–100.0)
PLATELETS: 64 10*3/uL — AB (ref 150–400)
PLATELETS: 40 10*3/uL — AB (ref 150–400)
Platelets: 233 10*3/uL (ref 150–400)
Platelets: 127 10*3/uL — ABNORMAL LOW (ref 150–400)
RBC: 2.44 MIL/uL — ABNORMAL LOW (ref 4.22–5.81)
RBC: 1.23 MIL/uL — ABNORMAL LOW (ref 4.22–5.81)
RBC: 4.28 MIL/uL (ref 4.22–5.81)
RBC: 5.17 MIL/uL (ref 4.22–5.81)
RDW: 16 % — ABNORMAL HIGH (ref 11.5–15.5)
RDW: 16.5 % — AB (ref 11.5–15.5)
RDW: 15.1 % (ref 11.5–15.5)
RDW: 15.2 % (ref 11.5–15.5)
WBC: 21.9 10*3/uL — AB (ref 4.0–10.5)
WBC: 19 10*3/uL — ABNORMAL HIGH (ref 4.0–10.5)
WBC: 10.1 10*3/uL (ref 4.0–10.5)
WBC: 11.3 10*3/uL — AB (ref 4.0–10.5)

## 2014-06-30 LAB — BLOOD GAS, ARTERIAL
ACID-BASE DEFICIT: 14.7 mmol/L — AB (ref 0.0–2.0)
ACID-BASE DEFICIT: 9.2 mmol/L — AB (ref 0.0–2.0)
BICARBONATE: 16.1 meq/L — AB (ref 20.0–24.0)
Bicarbonate: 14.1 mEq/L — ABNORMAL LOW (ref 20.0–24.0)
DRAWN BY: 31101
Drawn by: 39899
FIO2: 1 %
FIO2: 0.7 %
LHR: 14 {breaths}/min
LHR: 14 {breaths}/min
MECHVT: 550 mL
MECHVT: 550 mL
O2 SAT: 99.2 %
O2 Saturation: 95.9 %
PEEP: 5 cmH2O
PEEP: 5 cmH2O
PO2 ART: 120 mmHg — AB (ref 80.0–100.0)
PO2 ART: 221 mmHg — AB (ref 80.0–100.0)
Patient temperature: 98.6
Patient temperature: 98.6
TCO2: 15.8 mmol/L (ref 0–100)
TCO2: 17.2 mmol/L (ref 0–100)
pCO2 arterial: 55.2 mmHg — ABNORMAL HIGH (ref 35.0–45.0)
pCO2 arterial: 35.1 mmHg (ref 35.0–45.0)
pH, Arterial: 7.036 — CL (ref 7.350–7.450)
pH, Arterial: 7.285 — ABNORMAL LOW (ref 7.350–7.450)

## 2014-06-30 LAB — GLUCOSE, CAPILLARY: GLUCOSE-CAPILLARY: 161 mg/dL — AB (ref 70–99)

## 2014-06-30 LAB — HEMOGLOBIN AND HEMATOCRIT, BLOOD
HEMATOCRIT: 38.6 % — AB (ref 39.0–52.0)
Hemoglobin: 12.9 g/dL — ABNORMAL LOW (ref 13.0–17.0)

## 2014-06-30 LAB — TROPONIN I
TROPONIN I: 0.24 ng/mL — AB (ref <0.031)
Troponin I: 0.34 ng/mL — ABNORMAL HIGH (ref <0.031)

## 2014-06-30 LAB — BRAIN NATRIURETIC PEPTIDE: B NATRIURETIC PEPTIDE 5: 323.4 pg/mL — AB (ref 0.0–100.0)

## 2014-06-30 LAB — PREPARE RBC (CROSSMATCH)

## 2014-06-30 LAB — PROTIME-INR
INR: 1.49 (ref 0.00–1.49)
PROTHROMBIN TIME: 18.2 s — AB (ref 11.6–15.2)

## 2014-06-30 LAB — BLOOD PRODUCT ORDER (VERBAL) VERIFICATION

## 2014-06-30 LAB — LACTIC ACID, PLASMA
LACTIC ACID, VENOUS: 5.3 mmol/L — AB (ref 0.5–2.0)
Lactic Acid, Venous: 12.3 mmol/L (ref 0.5–2.0)

## 2014-06-30 LAB — TSH: TSH: 0.515 u[IU]/mL (ref 0.350–4.500)

## 2014-06-30 SURGERY — EGD (ESOPHAGOGASTRODUODENOSCOPY)
Anesthesia: Moderate Sedation

## 2014-06-30 MED ORDER — FENTANYL CITRATE 0.05 MG/ML IJ SOLN
50.0000 ug | Freq: Once | INTRAMUSCULAR | Status: AC
  Administered 2014-06-30: 50 ug via INTRAVENOUS
  Filled 2014-06-30: qty 2

## 2014-06-30 MED ORDER — SODIUM CHLORIDE 0.9 % IV SOLN
Freq: Once | INTRAVENOUS | Status: AC
  Administered 2014-06-30: 06:00:00 via INTRAVENOUS

## 2014-06-30 MED ORDER — SODIUM CHLORIDE 0.9 % IV SOLN: Freq: Once | INTRAVENOUS | Status: DC

## 2014-06-30 MED ORDER — MIDAZOLAM HCL 10 MG/2ML IJ SOLN
INTRAMUSCULAR | Status: DC | PRN
  Administered 2014-06-30 (×3): 1 mg via INTRAVENOUS

## 2014-06-30 MED ORDER — SODIUM CHLORIDE 0.9 % IV SOLN
25.0000 ug/h | INTRAVENOUS | Status: DC
  Administered 2014-06-30: 100 ug/h via INTRAVENOUS
  Filled 2014-06-30 (×2): qty 50

## 2014-06-30 MED ORDER — PIPERACILLIN-TAZOBACTAM 3.375 G IVPB
3.3750 g | Freq: Three times a day (TID) | INTRAVENOUS | Status: DC
  Administered 2014-06-30: 3.375 g via INTRAVENOUS
  Filled 2014-06-30 (×4): qty 50

## 2014-06-30 MED ORDER — FENTANYL CITRATE 0.05 MG/ML IJ SOLN
100.0000 ug | Freq: Once | INTRAMUSCULAR | Status: AC
Start: 2014-06-30 — End: 2014-06-30
  Administered 2014-06-30: 100 ug via INTRAVENOUS

## 2014-06-30 MED ORDER — MIDAZOLAM HCL 2 MG/2ML IJ SOLN
INTRAMUSCULAR | Status: AC
  Filled 2014-06-30: qty 6

## 2014-06-30 MED ORDER — CHLORHEXIDINE GLUCONATE 0.12 % MT SOLN
15.0000 mL | Freq: Two times a day (BID) | OROMUCOSAL | Status: DC
  Administered 2014-06-30: 15 mL via OROMUCOSAL
  Filled 2014-06-30: qty 15

## 2014-06-30 MED ORDER — FENTANYL CITRATE 0.05 MG/ML IJ SOLN
INTRAMUSCULAR | Status: AC
  Administered 2014-06-30: 50 ug
  Filled 2014-06-30: qty 2

## 2014-06-30 MED ORDER — LEVALBUTEROL HCL 0.63 MG/3ML IN NEBU: 0.6300 mg | INHALATION_SOLUTION | RESPIRATORY_TRACT | Status: DC | PRN

## 2014-06-30 MED ORDER — LORAZEPAM 2 MG/ML IJ SOLN: 1.0000 mg | INTRAMUSCULAR | Status: DC | PRN

## 2014-06-30 MED ORDER — FENTANYL BOLUS VIA INFUSION
50.0000 ug | INTRAVENOUS | Status: DC | PRN
  Filled 2014-06-30: qty 200

## 2014-06-30 MED ORDER — VASOPRESSIN 20 UNIT/ML IV SOLN
0.2000 [IU]/min | INTRAVENOUS | Status: DC
  Filled 2014-06-30 (×2): qty 5

## 2014-06-30 MED ORDER — EPINEPHRINE HCL 0.1 MG/ML IJ SOSY
PREFILLED_SYRINGE | INTRAMUSCULAR | Status: AC
  Filled 2014-06-30: qty 20

## 2014-06-30 MED ORDER — LORAZEPAM 2 MG/ML IJ SOLN
1.0000 mg/h | INTRAMUSCULAR | Status: DC
  Filled 2014-06-30: qty 25

## 2014-06-30 MED ORDER — CETYLPYRIDINIUM CHLORIDE 0.05 % MT LIQD: 7.0000 mL | Freq: Four times a day (QID) | OROMUCOSAL | Status: DC

## 2014-06-30 MED ORDER — LORAZEPAM BOLUS VIA INFUSION
2.0000 mg | INTRAVENOUS | Status: DC | PRN
Start: 2014-06-30 — End: 2014-07-01
  Filled 2014-06-30: qty 5

## 2014-06-30 MED ORDER — LABETALOL HCL 5 MG/ML IV SOLN
INTRAVENOUS | Status: AC
  Filled 2014-06-30: qty 4

## 2014-06-30 MED ORDER — FENTANYL CITRATE 0.05 MG/ML IJ SOLN
25.0000 ug/h | INTRAMUSCULAR | Status: DC
  Administered 2014-06-30: 100 ug/h via INTRAVENOUS
  Filled 2014-06-30: qty 50

## 2014-06-30 MED ORDER — FENTANYL BOLUS VIA INFUSION
25.0000 ug | INTRAVENOUS | Status: DC | PRN
  Administered 2014-06-30 (×3): 25 ug via INTRAVENOUS
  Filled 2014-06-30: qty 25

## 2014-06-30 MED ORDER — NOREPINEPHRINE BITARTRATE 1 MG/ML IV SOLN
2.0000 ug/min | INTRAVENOUS | Status: DC
  Administered 2014-06-30: 30 ug/min via INTRAVENOUS
  Filled 2014-06-30 (×2): qty 16

## 2014-07-01 ENCOUNTER — Encounter (HOSPITAL_COMMUNITY): Payer: Self-pay | Admitting: Gastroenterology

## 2014-07-01 LAB — TYPE AND SCREEN
ABO/RH(D): O NEG
Antibody Screen: NEGATIVE
UNIT DIVISION: 0
UNIT DIVISION: 0
UNIT DIVISION: 0
UNIT DIVISION: 0
UNIT DIVISION: 0
Unit division: 0
Unit division: 0
Unit division: 0
Unit division: 0
Unit division: 0
Unit division: 0
Unit division: 0

## 2014-07-01 LAB — PREPARE FRESH FROZEN PLASMA
UNIT DIVISION: 0
UNIT DIVISION: 0
Unit division: 0
Unit division: 0

## 2014-07-01 LAB — PREPARE PLATELET PHERESIS: Unit division: 0

## 2014-07-01 LAB — HEMOGLOBIN A1C
Hgb A1c MFr Bld: 7.3 % — ABNORMAL HIGH (ref 4.8–5.6)
Mean Plasma Glucose: 163 mg/dL

## 2014-07-01 MED FILL — Medication: Qty: 1 | Status: AC

## 2014-07-01 NOTE — Progress Notes (Signed)
Wasted fentanyl 230 ml down sink, witnessed by this nurse and Garnette CzechLexi Potter RN .

## 2014-07-01 NOTE — Discharge Summary (Signed)
[name] was a 69 y.o. male admitted on 06/26/2014 with hematemesis.  He was found to have cirrhosis on CT abdomen.  There was no reported hx of alcohol abuse.  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  His bleeding became progressively worse.  He developed hemorrhagic shock and this led to PEA cardiac arrest.  He was intubated and transferred to ICU.  He received multiple transfusions.  He had repeat EGD with banding of varix.  He was on octreotide, protonix, and vasopressin.  Family was informed of events.  They were in agreement that [name] had very poor quality of life related to orthopedic problems, chronic pain, and COPD.  They were concerned about the possibility of anoxic brain injury from cardiac arrest.  They were all in agreement that he would not want to be support on machines under any circumstance.  He was made DNR and transitioned to comfort care.  He was subsequently extubated and expired on 03-15-2015 at 21:35.  Final Diagnoses: Upper GI bleeding 2nd to gastric varices Cirrhosis of unknown cause Hemorrhagic shock PEA cardiac arrest Acute respiratory failure Aspiration pneumonia Hx of CAD Hx of GERD Hx of tobacco abuse GOLD IV COPD Thrombocytopenia Elevated troponin from demand ischemia Acute kidney injury Acute metabolic encephalopathy  Joseph HellingVineet Yaelis Scharfenberg, MD [facility] 07/01/2014, 5:40 AM

## 2014-07-02 ENCOUNTER — Telehealth: Payer: Self-pay

## 2014-07-02 LAB — TYPE AND SCREEN
ABO/RH(D): O NEG
Antibody Screen: NEGATIVE
UNIT DIVISION: 0
UNIT DIVISION: 0
Unit division: 0
Unit division: 0

## 2014-07-02 NOTE — Telephone Encounter (Signed)
[facility]/02/[facility].  Leaving at Dr. Evlyn CourierSood's desk per his request.  Will sign this afternoon.  [facility]

## 2014-07-03 ENCOUNTER — Telehealth: Payer: Self-pay

## 2014-07-03 LAB — TYPE AND SCREEN
ABO/RH(D): O NEG
Antibody Screen: NEGATIVE
UNIT DIVISION: 0
UNIT DIVISION: 0
Unit division: 0
Unit division: 0
Unit division: 0
Unit division: 0
Unit division: 0
Unit division: 0

## 2014-07-03 NOTE — Telephone Encounter (Signed)
[facility] 07/03/14.  Will hold for Dr. Craige CottaSood until he is back from vacation.  Sending to 2300 for signature 07/14/14.  Dr. Craige CottaSood said he would sign at office so courier brought back.  Took to Pulmonary and left with Shanda BumpsJessica to put in proper area so he could sign 07/15/14.  Received back 07/15/14 and called for pick-up.

## 2014-07-11 NOTE — Progress Notes (Signed)
Accessing pt's chart for QI data

## 2014-07-26 NOTE — Progress Notes (Signed)
ANTIBIOTIC CONSULT NOTE - INITIAL  Pharmacy Consult for Zosyn  Indication: Aspiration PNA  Allergies  Allergen Reactions  . Atrovent [Ipratropium]     REACTION: lips swelling    Patient Measurements: Height: 5\' 7"  (170.2 cm) Weight: 171 lb 8.3 oz (77.8 kg) IBW/kg (Calculated) : 66.1 Vital Signs: Temp: 95 F (35 C) (04/05 0700) Temp Source: Core (Comment) (04/05 0645) BP: 132/28 mmHg (04/05 0655) Pulse Rate: 107 (04/05 0700)  Labs:  Recent Labs  10/11/2014 0215  10/11/2014 2209 07/05/2014 0405 07/12/2014 0545  WBC 23.3*  < > 21.2* 21.9* 19.0*  HGB 11.1*  < > 7.5* 7.4* 3.6*  PLT 219  < > 241 233 127*  CREATININE 1.27  --   --  1.57*  --   < > = values in this interval not displayed. Estimated Creatinine Clearance: 41.5 mL/min (by C-G formula based on Cr of 1.57).  Assessment: 69 y/o M s/p CODE BLUE with ROSC, bright red blood from mouth, possible aspiration PNA, WBC is elevated, SCr up at 1.57, other labs as above.   Plan:  -Zosyn 3.375G IV q8h to be infused over 4 hours -Trend WBC, temp, renal function   [name], [name] 07/06/2014,7:03 AM

## 2014-07-26 NOTE — Progress Notes (Signed)
RN paged NP at [phone] secondary to pt being unresponsive, without respirations and pulse. Code was called and NP to bedside. CPR in progress upon arrival. See code report. Per RN, NT was at bedside and pt was somewhat restless, then "went out" per NT.  During code, bright red blood with clots in mouth and esophagus. Had dark maroon colored stool during ACLS as well.  After ACLS for 23 minutes, + SROC.  Dr. Shirlee LatchMcLean, IM resident with teaching service, responded to Code. Labs ordered. Pt transferred to 2H. Because of bleeding and EGD 4/4 (showing gastric varices-see report), Dr. Jearld PiesMcClean spoke with GI who asked for IR to evaluate pt for procedure. GI will also consider r/p EGD should IR not be able to proceed. IM resident also called IR but this NP doesn't know the results of that conversation. PCCM assuming care of pt. Updated daughter, ex wife, son and grandson about event and status in waiting room. Family is aware of critical status. Jimmye NormanKaren Kirby-Graham, NP [facility]

## 2014-07-26 NOTE — Op Note (Signed)
[facility] [address] [place], [id number]   ENDOSCOPY PROCEDURE REPORT  PATIENT: [name], [name]  MR#: [id number] BIRTHDATE: [birth year] , 68  yrs. old GENDER: male ENDOSCOPIST: Joseph FerdinandSam Silverio Hagan, MD REFERRED BY: PROCEDURE DATE:  June 30, 2014 PROCEDURE:  EGD with band ligation of a gastric varix ASA CLASS:     4 INDICATIONS:  ongoing gastrointestinal bleeding from the upper GI tract. The patient had an EGD yesterday with a suspected gastric varix. There was no history of cirrhosis of the liver. 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 There were no esophageal varices. The patient had further bleeding in the middle of the night and he had to be CODED. He was intubated. He received numerous blood transfusions. My partner Dr. Matthias Herring was notified last night. He contacted interventional radiology to see if there was a possibility of doing a TIPS procedure. Interventional radiology felt that a TIPS procedure on this patient would be extremely high risk. I spoke to interventional radiology this morning about the possibility of doing a Glue procedure for gastric varices. In the interim we will set up to do another emergent EGD this morning. It would take some time to set up any interventional radiological procedure and they would have to have coordinated it with anesthesia. We did not have time as the patient was bleeding out. I spoke to the family just prior to this endoscopic procedure and inform them of the critical nature of the patient and his ongoing bleeding. I explained that I could repeat endoscopy and if I was able to locate the bleeding varix go ahead and band it, although I told him this was very very high risk. They however agreed that something needed to be done emergently to try to stop the bleeding. I therefore obtained  consent from the patient's family. They understood that this was a last ditch emergent  attempt to control ongoing massive bleeding. MEDICATIONS: TOPICAL ANESTHETIC:  DESCRIPTION OF PROCEDURE: After the risks benefits and alternatives of the procedure were thoroughly explained, informed consent was obtained.  The Pentax      endoscope was introduced through the mouth and advanced to the second portion of the duodenum , Without limitations.  The instrument was slowly withdrawn as the mucosa was fully examined.principal attention of this examination was to the upper fundus and cardia of the stomach where the previously seen gastric varix was. There was a large clot and active bleeding coming from around this area. It took 30 minutes of suctioning and moving clot and blood from the fundus and cardia of the stomach to finally see the gastric varix with an ulcerated area on it. This is depicted in image 004. I then removed the scope and placed the bander on the scope. I then reinserted the scope and found the area of the gastric varix again. I was able to place 2 bands around the ulcerated gastric varix with success as seen in image 005. The scope was then removed.      The scope was then withdrawn from the patient and the procedure completed.  COMPLICATIONS: There were no immediate complications.  ENDOSCOPIC IMPRESSION:bleeding gastric varix banded with 2 bands. This was an emergency last ditch attempt to stop bleeding which had been ongoing.   RECOMMENDATIONS:continue octreotide. Continue PPI therapy. Watch for signs of further bleeding. If he continues to have further bleeding, radiology is on board in case he needs either Glue procedure or TIPS.the family  is aware of all of this and the critical nature of the patient. Mortality rate is high in this patient.   REPEAT EXAM:  eSignedWandalee Ferdinand, MD 22-Jul-2014 11:21 AM    CC:  CPT CODES: ICD CODES:  The ICD and CPT codes recommended by this software are interpretations from the data that the clinical  staff has captured with the software.  The verification of the translation of this report to the ICD and CPT codes and modifiers is the sole responsibility of the health care institution and practicing physician where this report was generated.  [employer] will not be held responsible for the validity of the ICD and CPT codes included on this report.  AMA assumes no liability for data contained or not contained herein. CPT is a Publishing rights manager of the [employer].  PATIENT NAME:  Joseph, Herring MR#: [id number]

## 2014-07-26 NOTE — Progress Notes (Signed)
CRITICAL VALUE ALERT  Critical value received:  ABG  Date of notification:  07/25/2014  Time of notification:  0705  Critical value read back:Yes.    Nurse who received alert:  Charlott HollerSarah burhnam  MD notified (1st page):  E-link - jill   Time of first page:  0745  MD notified (2nd page):  Time of second page:  Responding MD:  Christen BameNora sadek   Time MD responded:  0800

## 2014-07-26 NOTE — Progress Notes (Signed)
(  on-call coverage--telephone encounter)  Called by Dr. Shirlee LatchMcLean c. 5:45 a.m., reporting recurr major bleed w/ pt coding, PEA, intubation, successful resuscitation.  Hgb dropped from 7.4 to 3.6; plts and INR remain nl.   Pt receiving prc's (I recomm'd 4 units) plus 2 u FFP.  Already on octreotide.  sBP now 120.  IR has been contacted and will see pt this morning to consider emergent TIPS but on-call IR MD recomm'd repeat egd first.  Have arranged for egd by Dr. Evette CristalGanem (hosp covering MD for our group) c. 8:15 this morning.  Have spoken w/ pt's daughter, Joseph BraunKaren, regarding yesterday's endoscopic findings and subsequent CT confirming gastric varices, and our recomm for doing a repeat egd this morning and she is agreeable. She understands that gastric varices are not as amenable to endoscopic mgt as esoph varices, but that there are modalities (banding, injection, clipping) that can sometimes be performed.  [name], M.D. [phone]

## 2014-07-26 NOTE — Progress Notes (Signed)
CRITICAL VALUE ALERT  Critical value received:  Lactic acid   Date of notification:  07/16/2014  Time of notification:  0830  Critical value read back:Yes.    Nurse who received alert:  Loura BackJennifer [place]   MD notified (1st page):  sood   Time of first page:  0850  MD notified (2nd page):  Time of second page:  Responding MD:  sood   Time MD responded:  [phone]

## 2014-07-26 NOTE — Progress Notes (Signed)
Inpatient Diabetes Program Recommendations  AACE/ADA: New Consensus Statement on Inpatient Glycemic Control (2013)  Target Ranges:  Prepandial:   less than 140 mg/dL      Peak postprandial:   less than 180 mg/dL (1-2 hours)      Critically ill patients:  140 - 180 mg/dL   Consider starting ICU Hyperglycemia Protocol. Thank you  Piedad ClimesGina Kristelle Cavallaro BSN, RN,CDE Inpatient Diabetes Coordinator [phone] (team pager)

## 2014-07-26 NOTE — Progress Notes (Signed)
Pt passed away 07/03/2014 at 21:35. Pt in asystole. Garnette CzechLexi Khyrie Masi, RN and [place] Hod[place], RN listened for heart tones for five minutes. No heart tones heard. CDS notified. MD notified. No family at bedside. RN called patient's next of kin, daughter.

## 2014-07-26 NOTE — Progress Notes (Signed)
CRITICAL VALUE ALERT  Critical value received:  Cal 6.1, lactic acid 5.3  Date of notification:  07/12/2014  Time of notification:  1241  Critical value read back yes  Nurse who received alert:  Loura BackJennifer [place]  MD notified (1st page):  Christen BameNora Sadek, MD   Time of first page:  1245  MD notified (2nd page):  Time of second page:  Responding MD:  sadek  Time MD responded:  1245  Physician in room

## 2014-07-26 NOTE — Progress Notes (Signed)
RN called to pt room by nurse tech. When RN arrived to room NT just finish cleaning upthe patient. RN noticed pt very pale in color and O2 sats dropping.  Pt became unresponsive and HR dropping. Rapid Response called and On-call Provider. CPR initiated then ACLS when the code team arrived.  Pt intubated. Report given to RN on 2H for transfer.

## 2014-07-26 NOTE — Progress Notes (Signed)
After further discussion family has decide to proceed with withdrawal of ventilator support and transition to comfort care.  Orders placed.  Coralyn HellingVineet Adnan Vanvoorhis, MD [facility] Pulmonary/Critical [facility] 06/28/2014, 4:44 PM Pager:  [phone] After 3pm call: [phone]

## 2014-07-26 NOTE — Procedures (Signed)
Extubation Procedure Note  Patient Details:   Name: [name] DOB: 06-02-1945 MRN: [id number]   Airway Documentation:     Evaluation  O2 sats: transiently fell during during procedure Complications: No apparent complications Patient did tolerate procedure well. Bilateral Breath Sounds: Rhonchi Suctioning: Airway No   Patient extubated to nasal cannula per "Withdrawal of Life" guidelines.  No apparent complications.  [name], [name] 06/29/2014, 5:19 PM

## 2014-07-26 NOTE — Care Management Note (Signed)
    Page 1 of 1   07/13/2014     8:53:01 AM CARE MANAGEMENT NOTE 07/14/2014  Patient:  [name],[name]   Account Number:  [id number]  Date Initiated:  Jun 13, 2014  Documentation initiated by:  Junius CreamerWELL,DEBBIE  Subjective/Objective Assessment:   adm Herring upper gi bleed, vent     Action/Plan:   lives at home, pcp dr Johny Blamerwilliam harris   Anticipated DC Date:     Anticipated DC Plan:  [facility]         Choice offered to / List presented to:             Status of service:   Medicare Important Message given?   (If response is "NO", the following Medicare IM given date fields will be blank) Date Medicare IM given:   Medicare IM given by:   Date Additional Medicare IM given:   Additional Medicare IM given by:    Discharge Disposition:    Per UR Regulation:  Reviewed for med. necessity/level of care/duration of stay  If discussed at Long Length of Stay Meetings, dates discussed:    Comments:

## 2014-07-26 NOTE — H&P (Signed)
PULMONARY / CRITICAL CARE MEDICINE   Name: [name] MRN: [id number] DOB: 09-Feb-1946    ADMISSION DATE:  July 24, 2014 CONSULTATION DATE:  07/01/2014  REFERRING MD :  Roda Shutters  CHIEF COMPLAINT:  Cardiac Arrest  INITIAL PRESENTATION:  69 y.o. M brought to [facility] ED 4/4 with hematemesis.  EGD and CT revealed gastric varices.  Early AM 4/5, had agonal respirations and later respiratory arrest leading to cardiac arrest.  He was intubated and transferred to ICU for further management.   STUDIES:  CT A/P 4/4 >>>  Cirrhosis, proximal gastric varices, nephrolithisis, liver cysts EGD 4/4 >>> concern for gastric varix  SIGNIFICANT EVENTS: 4/4 - admit 4/5 - respiratory leading to cardiac arrest.  Intubated, transferred to ICU   HISTORY OF PRESENT ILLNESS:  Pt is encephalopathic; therefore, this HPI is obtained from chart review. [name] is a 69 y.o. M with PMH as outlined below.  He presented to [facility] ED 4/4 via EMS for hematemesis. On EMS arrival, he had bright red hematemesis and per his report, earlier that evening he had dark tarry stools.  He was admitted by [facility] and was seen in consultation by GI Evette Cristal).  EGD performed 4/4 showed concern for gastric varix.  CT abd / pelv was obtained and showed morphologic features of liver compatible with cirrhosis, proximal gastric varices which correspond with findings identified on endoscopy, nephrolithiasis, atherosclerotic disease, liver cysts. He was placed on IV PPI as well as octreotide.  During early AM hours 4/5, pt was found with agonal respirations before he lost his pulses.  ACLS protocol was initiated and ROSC was achieved after 18 minutes.  During ACLS, he passed dark red blood per rectum and had bright red blood from OGT as well as ETT.  Multiple clots were suctioned out of ETT after intubation.  He was transferred to ICU for further management.   PAST MEDICAL HISTORY :   has a past medical history of Arthropathy, unspecified, site  unspecified; Old myocardial infarction; Other premature beats; Chronic airway obstruction, not elsewhere classified; Other and unspecified hyperlipidemia; Essential hypertension, benign; Coronary atherosclerosis of native coronary artery; GERD (gastroesophageal reflux disease); Arthritis; and Ventral hernias - periumbilical, s/p lap repair w mesh 03/13/2013 (09/21/2011).  has past surgical history that includes Appendectomy; Mitral valve replacement (2008); Coronary angioplasty; Ventral hernia repair (N/A, 03/13/2013); Insertion of mesh (N/A, 03/13/2013); and Hernia repair. Prior to Admission medications   Medication Sig Start Date End Date Taking? Authorizing Provider  albuterol (PROAIR HFA) 108 (90 BASE) MCG/ACT inhaler USE 1 TO 2 PUFFS BY MOUTH EVERY 6 HOURS AS NEEDED FOR WHEEZING OR SHORTNESS OF BREATH 04/03/14  Yes Tammy S Parrett, NP  amLODipine (NORVASC) 5 MG tablet Take 1 tablet (5 mg total) by mouth daily with breakfast. 12/05/13  Yes Pricilla Riffle, MD  aspirin 81 MG tablet Take 81 mg by mouth daily.    Yes Historical Provider, MD  Fluticasone-Salmeterol (ADVAIR DISKUS) 250-50 MCG/DOSE AEPB Inhale 1 puff into the lungs 2 (two) times daily. 04/27/14  Yes Oretha Milch, MD  guaiFENesin (MUCINEX) 600 MG 12 hr tablet Take 600 mg by mouth 2 (two) times daily as needed for cough.    Yes Historical Provider, MD  ibuprofen (ADVIL,MOTRIN) 200 MG tablet Take 200 mg by mouth every 6 (six) hours as needed for mild pain.    Yes Historical Provider, MD  losartan (COZAAR) 50 MG tablet Take 1 tablet (50 mg total) by mouth daily with breakfast. 12/05/13  Yes Gunnar Fusi  Ronnell Freshwateross V, MD  metoprolol tartrate (LOPRESSOR) 25 MG tablet TAKE 1 TABLET (25 MG TOTAL) BY MOUTH 2 (TWO) TIMES DAILY. 12/05/13  Yes Pricilla RifflePaula Ross V, MD  simvastatin (ZOCOR) 40 MG tablet Take 1 tablet (40 mg total) by mouth daily at 6 PM. Takes at bedtime 12/05/13  Yes Pricilla RifflePaula Ross V, MD  docusate sodium (COLACE) 100 MG capsule Take 100 mg by mouth daily as needed.      Historical Provider, MD   Allergies  Allergen Reactions  . Atrovent [Ipratropium]     REACTION: lips swelling    FAMILY HISTORY:  Family History  Problem Relation Age of Onset  . Heart disease Mother 5270    CABG    SOCIAL HISTORY:  reports that he quit smoking about 8 years ago. His smoking use included Cigarettes, Pipe, and Cigars. He has a 80 pack-year smoking history. He has never used smokeless tobacco. He reports that he does not drink alcohol or use illicit drugs.  REVIEW OF SYSTEMS:  Unable to complete as pt is encephalopathic.  SUBJECTIVE:   VITAL SIGNS: Temp:  [94.8 F (34.9 C)-97.6 F (36.4 C)] 94.8 F (34.9 C) (04/05 0545) Pulse Rate:  [85-157] 104 (04/05 0605) Resp:  [14-26] 20 (04/05 0605) BP: (95-200)/(33-88) 116/46 mmHg (04/05 0605) SpO2:  [91 %-100 %] 100 % (04/05 0605) FiO2 (%):  [100 %] 100 % (04/05 0545) Weight:  [76.658 kg (169 lb)-77.8 kg (171 lb 8.3 oz)] 77.8 kg (171 lb 8.3 oz) (04/04 2000) HEMODYNAMICS:   VENTILATOR SETTINGS: Vent Mode:  [-] PRVC FiO2 (%):  [100 %] 100 % Set Rate:  [14 bmp] 14 bmp Vt Set:  [550 mL] 550 mL PEEP:  [5 cmH20] 5 cmH20 Plateau Pressure:  [17 cmH20] 17 cmH20 INTAKE / OUTPUT: Intake/Output      04/04 0701 - 04/05 0700   I.V. (mL/kg) 1200 (15.4)   Total Intake(mL/kg) 1200 (15.4)   Urine (mL/kg/hr) 300 (0.2)   Total Output 300   Net +900       Stool Occurrence 1 x     PHYSICAL EXAMINATION: General: Obese male, post cardiac arrest. Neuro: Non-responsive but starting to have purposeful movements. HEENT: [place]/AT. PERRL, sclerae anicteric. Peri-oral dried blood. Cardiovascular: Tachy, regular, no M/R/G.  Lungs: Respirations even and unlabored.  Coarse bilaterally. Abdomen: BS x 4, soft, NT/ND.  Musculoskeletal: No gross deformities, no edema.  Skin: Intact, warm, no rashes.  LABS:  CBC  Recent Labs Lab 02-07-2015 2209 07/21/2014 0405 07/20/2014 0545  WBC 21.2* 21.9* 19.0*  HGB 7.5* 7.4* 3.6*  HCT 21.7* 22.4*  11.7*  PLT 241 233 127*   Coag's  Recent Labs Lab 07/20/2014 0405  INR 1.49   BMET  Recent Labs Lab 02-07-2015 0215 07/08/2014 0405  NA 139 141  K 4.2 4.4  CL 106 112  CO2 23 20  BUN 46* 64*  CREATININE 1.27 1.57*  GLUCOSE 208* 185*   Electrolytes  Recent Labs Lab 02-07-2015 0215 07/03/2014 0405  CALCIUM 9.0 8.3*   Sepsis Markers No results for input(s): LATICACIDVEN, PROCALCITON, O2SATVEN in the last 168 hours. ABG No results for input(s): PHART, PCO2ART, PO2ART in the last 168 hours. Liver Enzymes  Recent Labs Lab 02-07-2015 0215  AST 40*  ALT 24  ALKPHOS 66  BILITOT 0.5  ALBUMIN 2.5*   Cardiac Enzymes  Recent Labs Lab 02-07-2015 1750 02-07-2015 2209 07/17/2014 0405  TROPONINI 0.15* 0.25* 0.24*   Glucose No results for input(s): GLUCAP in the last 168 hours.  Imaging Ct Abdomen Pelvis W Contrast  07/13/2014   CLINICAL DATA:  Hematemesis. Gastric varices identified at endoscopy.  EXAM: CT ABDOMEN AND PELVIS WITH CONTRAST  TECHNIQUE: Multidetector CT imaging of the abdomen and pelvis was performed using the standard protocol following bolus administration of intravenous contrast.  CONTRAST:  OMNIPAQUE IOHEXOL 300 MG/ML  SOLN  COMPARISON:  11/14/2008  FINDINGS: Lower chest: No pleural effusion identified. The lung bases are clear.  Hepatobiliary: Low-attenuation foci are identified within the liver compatible with liver cysts. The liver has a progressive nodular contour or with hypertrophy of the lateral segment and caudate lobe. Gallstone noted. No biliary dilatation.  Pancreas: Normal appearance of the pancreas.  Spleen: No focal splenic abnormality. The spleen measures 11 cm in length.  Adrenals/Urinary Tract: The adrenal glands are both normal. Several right renal calculi a noted. The largest is in the inferior pole measuring 5 mm. Several small cysts noted within the inferior pole of right kidney. The left kidney is normal. The urinary bladder is unremarkable.   Stomach/Bowel: Prominent gastric varices intent the posterior aspect of the gastric fundus, image 14 of series 201. The stomach is otherwise unremarkable. The small bowel loops have a normal course and caliber. Multiple distal colonic diverticula are noted. No acute inflammation  Vascular/Lymphatic: Calcified atherosclerotic disease involves the abdominal aorta. No aneurysm. The portal vein appears patent. The splenic vein is also patent.  Reproductive: Prostate gland and seminal vesicles are unremarkable.  Other: There is no ascites or focal fluid collections within the abdomen or pelvis.  Musculoskeletal: Review of the visualized osseous structures is negative for aggressive lytic or sclerotic bone lesion.  IMPRESSION: 1. Morphologic features of liver compatible with cirrhosis. 2. Proximal gastric varices which correspond with the findings identified at endoscopy. 3. Nephrolithiasis 4. Atherosclerotic disease 5. Liver cysts.   Electronically Signed   By: Signa Kell M.D.   On: Jul 13, 2014 19:02   Dg Chest Portable 1 View  Jul 13, 2014   CLINICAL DATA:  Hemoptysis.  Shortness of breath  EXAM: PORTABLE CHEST - 1 VIEW  COMPARISON:  08/01/2012  FINDINGS: Hyperinflation and apical lucency. There is no edema, consolidation, effusion, or pneumothorax. Normal heart size and aortic contours. No gross source for hemoptysis. No gross alveolar hemorrhage.  IMPRESSION: COPD without acute superimposed disease.   Electronically Signed   By: Marnee Spring M.D.   On: 2014-07-13 02:57      ASSESSMENT / PLAN:  GASTROINTESTINAL A:   Active upper GI bleed Gastric Varix Cirrhosis GI prophylaxis Nutrition P:   GI and IR called.  Both will see this AM and GI to likely re-do EGD and perform banding of varices.  If re-bleeds, then IR will consider emergent TIPS. Continue PPI gtt, Octreotide. Vasopressin added. NPO.  HEMATOLOGIC A:   Acute blood loss anemia due to active UGIB Thrombocytopenia VTE Prophylaxis P:   Transfuse 2u FFP and 4u PRBC's. GI to see for emergent endoscopy with banding of varices if able. Monitor platelets. SCD's only. CBC in AM.  PULMONARY OETT 4/5 >>> A: Acute hypoxic respiratory failure following respiratory / cardiac arrest Probable aspiration PNA Hx COPD P:   Full mechanical support, wean as able. VAP bundle. SBT when able. Abx per ID section. Xopenex. CXR in AM.  CARDIOVASCULAR CVL L IJ 4/5 >>> A:  Hemorrhagic shock Troponin leak - likely due to demand ischemia Hx HTN, HLD, CAD P:  Vasopressin to maintain MAP > 65. Levophed PRN. Repeat troponin / lactate. D/c cardizem. Hold  outpatient losartan, lopressor, simvastatin, aspirin.  RENAL A:   AKI P:   NS @ 100. BMP in AM.  INFECTIOUS A:   Leukocytosis - probable aspiration due to significant hematemesis (per CRNA, cords covered in blood during intubation and post intubation, clots suctioned from ETT) P:   Sputum Cx 4/5 > Abx: Zosyn, start date 4/5, day 1/x.  ENDOCRINE A:   No known issues  P:   SSI if glucose consistently > 180.  NEUROLOGIC A:   Acute metabolic encephalopathy P:   Sedation:  Fentanyl gtt. RASS goal: 0 to -1. Daily WUA.   Family updated: Family contacted by code team.  Interdisciplinary Family Meeting v Palliative Care Meeting:  Due by: 4/11.   Rutherford Guys, [place] - [facility] Pulmonary & Critical Care Medicine Pager: [phone]  or [phone] 18-Jul-2014, 6:11 AM  Reviewed above, and examined.  D/w Dr. Evette Cristal.  69 yo male admitted 4/04 with hematemesis from gastric varices in setting of newly diagnoses cirrhosis.  There is no reported hx of excessive alcohol use.  He had EGD done.  He later developed respiratory arrest, altered mental status and then PEA arrest.  He was found to have significantly more GI bleeding with Hb down to 3 and hemorrhagic shock.  He was intubated and had CPR for 18 minutes before ROSC.  He had repeat EGD and banding of varices this  AM.  He has received multiple transfusions of PRBC, FFP, and PLT.  He is comatose, on vent and pressors.  He has bloody drainage from OG tube and oozing from CVL site.  He has b/l crackles, regular heart sounds, distended abdomen, no edema, extremities cool, multiple tattoos.  Will continue vent support, pressors, and hemodynamic support with transfusions.  Continue Abx for aspiration PNA.  Discussed with pt's family.  They have informed me that Mr. Cadden was suffering from terrible arthritis and his quality of life was miserable.  He was in d/w them about living will and DNR the day prior to his cardiac arrest >> he would never want to be on machines.  They are in agreement to continue current therapies and transfusions to support him.  They would not want any additional procedures (TIPS, surgery, repeat EGD).  They do not want any further escalation of care beyond what is being done.  If his medical status gets worse, then would transition to comfort measures.  DNR ordered placed.  CC time by me independent of APP/resident time is 60 minutes.  Coralyn Helling, MD [facility] Pulmonary/Critical Care 07-18-2014, 11:19 AM Pager:  [phone] After 3pm call: [phone]

## 2014-07-26 NOTE — Procedures (Signed)
Central Venous Catheter Insertion Procedure Note Illene Silverhomas W Ramdass [id number] Apr 23, 1945  Procedure: Insertion of Central Venous Catheter Indications: Assessment of intravascular volume, Drug and/or fluid administration and Frequent blood sampling  Procedure Details Consent: Unable to obtain consent because of altered level of consciousness. Time Out: Verified patient identification, verified procedure, site/side was marked, verified correct patient position, special equipment/implants available, medications/allergies/relevent history reviewed, required imaging and test results available.  Performed  Maximum sterile technique was used including antiseptics, cap, gloves, gown, hand hygiene, mask and sheet. Skin prep: Chlorhexidine; local anesthetic administered A antimicrobial bonded/coated triple lumen catheter was placed in the left internal jugular vein using the Seldinger technique.  Evaluation Blood flow good Complications: No apparent complications Patient did tolerate procedure well. Chest X-ray ordered to verify placement.  CXR: pending.  Procedure performed under direct ultrasound guidance for real time vessel cannulation.      Rutherford Guysahul Desai, [place] - [facility] Pager: [phone]  or [phone] 07/08/2014, 6:09 AM

## 2014-07-26 NOTE — Progress Notes (Signed)
Patient ID: [name], male   DOB: [birth year], 69 y.o.   MRN: [id number]   IR aware of this pt Dr Shirlee LatchMcLean has spoken to Dr Fredia SorrowYamagata Possible emergent TIPs procedure post large amt hematemesis this am hgb drop to 3.6 Code called  Now for repeat EGD---being performed in room  Will await findings and outcome/poss treatment of EGD

## 2014-07-26 NOTE — Progress Notes (Signed)
Received pt. From 2C. Upper GI bleed who coded in 2C and was transported to 2H 05. Pt. Covered in blood and 3 units of FFP and 2 units of RBC given emergently. Bleeding out of his NG tube, ETT tube, peripheral IV sites, and dark colored stool.

## 2014-07-26 NOTE — Consult Note (Signed)
Chief Complaint: Chief Complaint  Patient presents with  . Hemoptysis   Referring Physician(s): Bing MatterGanem, [facility]  History of Present Illness: [name] is a 69 y.o. male with previously undiagnosed cirrhosis who was admitted to the hospital for hematemesis. Patient underwent endoscopy performed by Dr. 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 Endoscopy demonstrated clot but no active bleeding and as such dedicated endoscopic intervention (i.e. banding or sclerotherapy) was not performed at that time.   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 The patient developed massive hematemesis subsequently experiencing a PEA arrest and requiring intubation.   [facility] for consideration of potential percutaneous treatment options (i.e. TIPS and/or BRTO).  The patient has received 4 units of blood products does not currently on pressors. Patient remains intubated in the ICU. The patient's family is in the ICU waiting room.   Note, the patient's family is considering changing the patient's CODE STATUS to DO NOT RESUSCITATE.  Past Medical History  Diagnosis Date  . Arthropathy, unspecified, site unspecified   . Old myocardial infarction   . Other premature beats   . Chronic airway obstruction, not elsewhere classified   . Other and unspecified hyperlipidemia   . Essential hypertension, benign   . Coronary atherosclerosis of native coronary artery     DR. PAULA ROSS IS PT'S CARDIOLOGIST  . GERD (gastroesophageal reflux disease)     PT STATES MINOR HEARTBURN -WOULD TAKE MAALOX OR OTHER OVER THE COUNTER MED  . Arthritis     IN HANDS  . Ventral hernias - periumbilical, s/p lap repair w mesh 03/13/2013 09/21/2011    Past Surgical History  Procedure Laterality Date  . Appendectomy    .  Ptca  2008  . Coronary angioplasty    . Ventral hernia repair N/A 03/13/2013    Procedure: LAPAROSCOPIC VENTRAL WALL  HERNIA;  Surgeon: Ardeth SportsmanSteven C. Gross, MD;  Location: [facility];  Service: General;  Laterality: N/A;  . Insertion of mesh N/A 03/13/2013    Procedure: INSERTION OF MESH;  Surgeon: Ardeth SportsmanSteven C. Gross, MD;  Location: [facility];  Service: General;  Laterality: N/A;  . Hernia repair    . Esophagogastroduodenoscopy (egd) with propofol N/A 06/28/2014    Procedure: ESOPHAGOGASTRODUODENOSCOPY (EGD) WITH PROPOFOL;  Surgeon: Wandalee FerdinandSam Ganem, MD;  [facility];  Service: Endoscopy;  Laterality: N/A;    Allergies: Atrovent  Medications: Prior to Admission medications   Medication Sig Start Date End Date Taking? Authorizing Provider  albuterol (PROAIR HFA) 108 (90 BASE) MCG/ACT inhaler USE 1 TO 2 PUFFS BY MOUTH EVERY 6 HOURS AS NEEDED FOR WHEEZING OR SHORTNESS OF BREATH 04/03/14  Yes Tammy S Parrett, NP  amLODipine (NORVASC) 5 MG tablet Take 1 tablet (5 mg total) by mouth daily with breakfast. 12/05/13  Yes Pricilla RifflePaula Ross V, MD  aspirin 81 MG tablet Take 81 mg by mouth daily.    Yes Historical Provider, MD  Fluticasone-Salmeterol (ADVAIR DISKUS) 250-50 MCG/DOSE AEPB Inhale 1 puff into the lungs 2 (two) times daily. 04/27/14  Yes Oretha Milchakesh Alva V, MD  guaiFENesin (MUCINEX) 600 MG 12 hr tablet Take 600 mg by mouth 2 (two) times daily as needed for cough.    Yes Historical Provider, MD  ibuprofen (ADVIL,MOTRIN) 200 MG tablet Take 200 mg by mouth every 6 (six) hours as needed for mild pain.  Yes Historical Provider, MD  losartan (COZAAR) 50 MG tablet Take 1 tablet (50 mg total) by mouth daily with breakfast. 12/05/13  Yes Pricilla Riffle, MD  metoprolol tartrate (LOPRESSOR) 25 MG tablet TAKE 1 TABLET (25 MG TOTAL) BY MOUTH 2 (TWO) TIMES DAILY. 12/05/13  Yes Pricilla Riffle, MD  simvastatin (ZOCOR) 40 MG tablet Take 1 tablet (40 mg total) by mouth daily at 6 PM. Takes at bedtime 12/05/13  Yes Pricilla Riffle, MD  docusate  sodium (COLACE) 100 MG capsule Take 100 mg by mouth daily as needed.     Historical Provider, MD     Family History  Problem Relation Age of Onset  . Heart disease Mother 30    CABG    History   Social History  . Marital Status: Divorced    Spouse Name: N/A  . Number of Children: N/A  . Years of Education: N/A   Occupational History  . 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 Smoking status: Former Smoker -- 2.00 packs/day for 40 years    Types: Cigarettes, Pipe, Cigars    Quit date: 03/27/2006  . Smokeless tobacco: Never Used  . Alcohol Use: No  . Drug Use: No  . Sexual Activity: Not on file   Other Topics Concern  . None   Social History Narrative    Review of Systems: A 12 point ROS discussed and pertinent positives are indicated in the HPI above.  All other systems are negative.  Review of Systems  Vital Signs: BP 134/55 mmHg  Pulse 90  Temp(Src) 90 F (32.2 C) (Core (Comment))  Resp 20  Ht 5\' 7"  (1.702 m)  Wt 171 lb 8.3 oz (77.8 kg)  BMI 26.86 kg/m2  SpO2 100%  Physical Exam  Constitutional: He appears well-developed.  Pulmonary/Chest:  Intubated  Neurological:  Intubated and sedated  Psychiatric:  Intubated and sedated    Mallampati Score:  MD Evaluation Airway: WNL Heart: WNL Abdomen: WNL Chest/ Lungs: WNL ASA  Classification: 3 Mallampati/Airway Score: Three  Imaging: Ct Abdomen Pelvis W Contrast  07/12/2014   CLINICAL DATA:  Hematemesis. Gastric varices identified at endoscopy.  EXAM: CT ABDOMEN AND PELVIS WITH CONTRAST  TECHNIQUE: Multidetector CT imaging of the abdomen and pelvis was performed using the standard protocol following bolus administration of intravenous contrast.  CONTRAST:  OMNIPAQUE IOHEXOL 300 MG/ML  SOLN  COMPARISON:  11/14/2008  FINDINGS: Lower chest: No pleural effusion identified. The lung bases are clear.  Hepatobiliary: Low-attenuation foci are identified within the  liver compatible with liver cysts. The liver has a progressive nodular contour or with hypertrophy of the lateral segment and caudate lobe. Gallstone noted. No biliary dilatation.  Pancreas: Normal appearance of the pancreas.  Spleen: No focal splenic abnormality. The spleen measures 11 cm in length.  Adrenals/Urinary Tract: The adrenal glands are both normal. Several right renal calculi a noted. The largest is in the inferior pole measuring 5 mm. Several small cysts noted within the inferior pole of right kidney. The left kidney is normal. The urinary bladder is unremarkable.  Stomach/Bowel: Prominent gastric varices intent the posterior aspect of the gastric fundus, image 14 of series 201. The stomach is otherwise unremarkable. The small bowel loops have a normal course and caliber. Multiple distal colonic diverticula are noted. No acute inflammation  Vascular/Lymphatic: Calcified atherosclerotic disease involves the abdominal aorta. No aneurysm. The portal vein appears patent. The  splenic vein is also patent.  Reproductive: Prostate gland and seminal vesicles are unremarkable.  Other: There is no ascites or focal fluid collections within the abdomen or pelvis.  Musculoskeletal: Review of the visualized osseous structures is negative for aggressive lytic or sclerotic bone lesion.  IMPRESSION: 1. Morphologic features of liver compatible with cirrhosis. 2. Proximal gastric varices which correspond with the findings identified at endoscopy. 3. Nephrolithiasis 4. Atherosclerotic disease 5. Liver cysts.   Electronically Signed   By: Signa Kell M.D.   On: 11-Jul-2014 19:02   Dg Chest Port 1 View  07/23/2014   CLINICAL DATA:  Endotracheal tube placement. Central line placement. Respiratory distress.  EXAM: PORTABLE CHEST - 1 VIEW  COMPARISON:  2014/07/11  FINDINGS: Left IJ line tip: SVC. Endotracheal tube satisfactorily position with tip 6 cm above the carina. Nasogastric tube enters the stomach.  The lungs appear  clear. Atherosclerotic aortic arch. Heart size within normal limits.  IMPRESSION: 1. Interval intubation with tubes and lines satisfactorily position.   Electronically Signed   By: Gaylyn Rong M.D.   On: 06/26/2014 07:17   Dg Chest Portable 1 View  11-Jul-2014   CLINICAL DATA:  Hemoptysis.  Shortness of breath  EXAM: PORTABLE CHEST - 1 VIEW  COMPARISON:  08/01/2012  FINDINGS: Hyperinflation and apical lucency. There is no edema, consolidation, effusion, or pneumothorax. Normal heart size and aortic contours. No gross source for hemoptysis. No gross alveolar hemorrhage.  IMPRESSION: COPD without acute superimposed disease.   Electronically Signed   By: Marnee Spring M.D.   On: 07-11-14 02:57    Labs:  CBC:  Recent Labs  07/08/2014 0405 07/02/2014 0545 07/18/2014 1100 07/16/2014 1450  WBC 21.9* 19.0* 10.1 11.3*  HGB 7.4* 3.6* 12.9*  12.9* 15.4  HCT 22.4* 11.7* 38.6*  38.2* 45.1  PLT 233 127* 64* 40*    COAGS:  Recent Labs  07/16/2014 0405  INR 1.49    BMP:  Recent Labs  07/11/2014 0215 07/04/2014 0405 07/22/2014 0545 07/08/2014 1100  NA 139 141 143 148*  K 4.2 4.4 3.7 3.9  CL 106 112 115* 120*  CO2 23 20 18* 22  GLUCOSE 208* 185* 262* 171*  BUN 46* 64* 61* 57*  CALCIUM 9.0 8.3* 6.9* 6.1*  CREATININE 1.27 1.57* 1.81* 1.69*  GFRNONAA 56* 43* 36* 40*  GFRAA 65* 50* 42* 46*    LIVER FUNCTION TESTS:  Recent Labs  12/05/13 1431 07/11/14 0215 07/19/2014 0545  BILITOT  --  0.5 0.4  AST 38* 40* 85*  ALT  --  24 52  ALKPHOS  --  66 47  PROT  --  4.7* <3.0*  ALBUMIN  --  2.5* <1.0*    TUMOR MARKERS: No results for input(s): AFPTM, CEA, CA199, CHROMGRNA in the last 8760 hours.   Calculated MELD: 15  Assessment and Plan:  [name] is a 69 y.o. male with previously undiagnosed cirrhosis who was admitted to the hospital for hematemesis with subsequent endoscopy demonstrating the presence of several enlarged varices about the gastric fundus.  Dr. Evette Cristal was at  the patient's bedside earlier this morning performing and repeat endoscopy and was able to successfully band any visible varices.  Prolonged discussions were held with the patient's ex-wife, daughter (who serves as the patient's healthcare power of attorney), son and grandson, regarding potential percutaneous treatment options for this critically ill patient.   Risks and benefits regarding both TIPS and BRTO were discussed at length with the patient, however at  the present time given the patient's poor prognosis as well as multiple medical co-morbidiites, the patient's family does not wish to pursue additional aggressive treatment at this time.  As such, no intervention will be performed.  SignedSimonne Come 07/22/2014, 4:53 PM   I spent a total of 40 Minutes  in face to face in clinical consultation with the patient's family, greater than 50% of which was counseling/coordinating care for hematemesis

## 2014-07-26 NOTE — Code Documentation (Signed)
CODE BLUE NOTE  Patient Name: [name]   MRN: [id number]   Date of Birth/ Sex: December 26, 1945 , male      Admission Date: 06/27/2014  Attending Provider: Albertine GratesFang Xu, MD  Primary Diagnosis: Upper GI bleed    Indication: Pt was in his usual state of health until this AM at 4:57 when he was noted to be unresponsive with agonal breathing and then subsequently lost a pulse. Code blue was subsequently called. At the time of arrival on scene, ACLS protocol was underway.    Technical Description:  - CPR performance duration:  18  minutes  - Was defibrillation or cardioversion used? No   - Was external pacer placed? No  - Was patient intubated pre/post CPR? Yes- at 0508    Medications Administered: Y = Yes; Blank = No Amiodarone     Atropine    Calcium    Epinephrine   Y x 5  Lidocaine    Magnesium    Norepinephrine    Phenylephrine    Sodium bicarbonate   Y x 2  Vasopressin      Post CPR evaluation:  - Final Status - Was patient successfully resuscitated ? Yes - What is current rhythm? NSR - What is current hemodynamic status? Persistent shock (BP 101/72, HR 103)   Miscellaneous Information:  - Labs sent, including: Lactic acid, troponins, CBC, ABG, Type and screen, i-STAT chem8  - Primary team notified?  Yes  - Family Notified? Yes  - Additional notes/ transfer status:  Patient presenting with UGI bleed, thought to be secondary to varices noted on EGD. CT revealed cirrhosis, liver cysts, and proximal gastric varices. Patient's mst recent INR 1.49/ PT 18.2 Hemoglobin 7.4.  During resuscitation, patient was noted to have lost at least 800cc of bright red blood orally/rectally. Dr. Matthias HughsBuccini with GI was called about possible emergent treatments. He agreed with giving 2u FFP and suggested 2u pRBCs. He recommended:  1) call IR for emergent TIPs if he can tolerate it, if not; 2) Dr. Huntley DecBuccinin or other GI MD can perform EGD with clipping, if this fails, 3)  GI to attempt [place] tube.  Patient transferred to ICU. ICU PA present and updated on attending.   Joanna Puffrystal S Dorsey, MD  07/08/2014, 5:32 AM

## 2014-07-26 NOTE — Progress Notes (Signed)
CRITICAL VALUE ALERT  Critical value received:  HGB 3.6  Date of notification:  07/14/2014  Time of notification:  0606  Critical value read back:Yes.    Nurse who received alert:  Daine Florasyan Jaevion Goto  MD notified (1st page):  Charlotta Newtonahual Desai  Time of first page:  MD in room

## 2014-07-26 DEATH — deceased

## 2015-12-23 IMAGING — CR DG CHEST 1V PORT
1 series · 1 of 1 positions shown · non-contrast
Comparison: 08/01/2012

CLINICAL DATA: Hemoptysis.  Shortness of breath

EXAM:
PORTABLE CHEST - 1 VIEW

[AP]
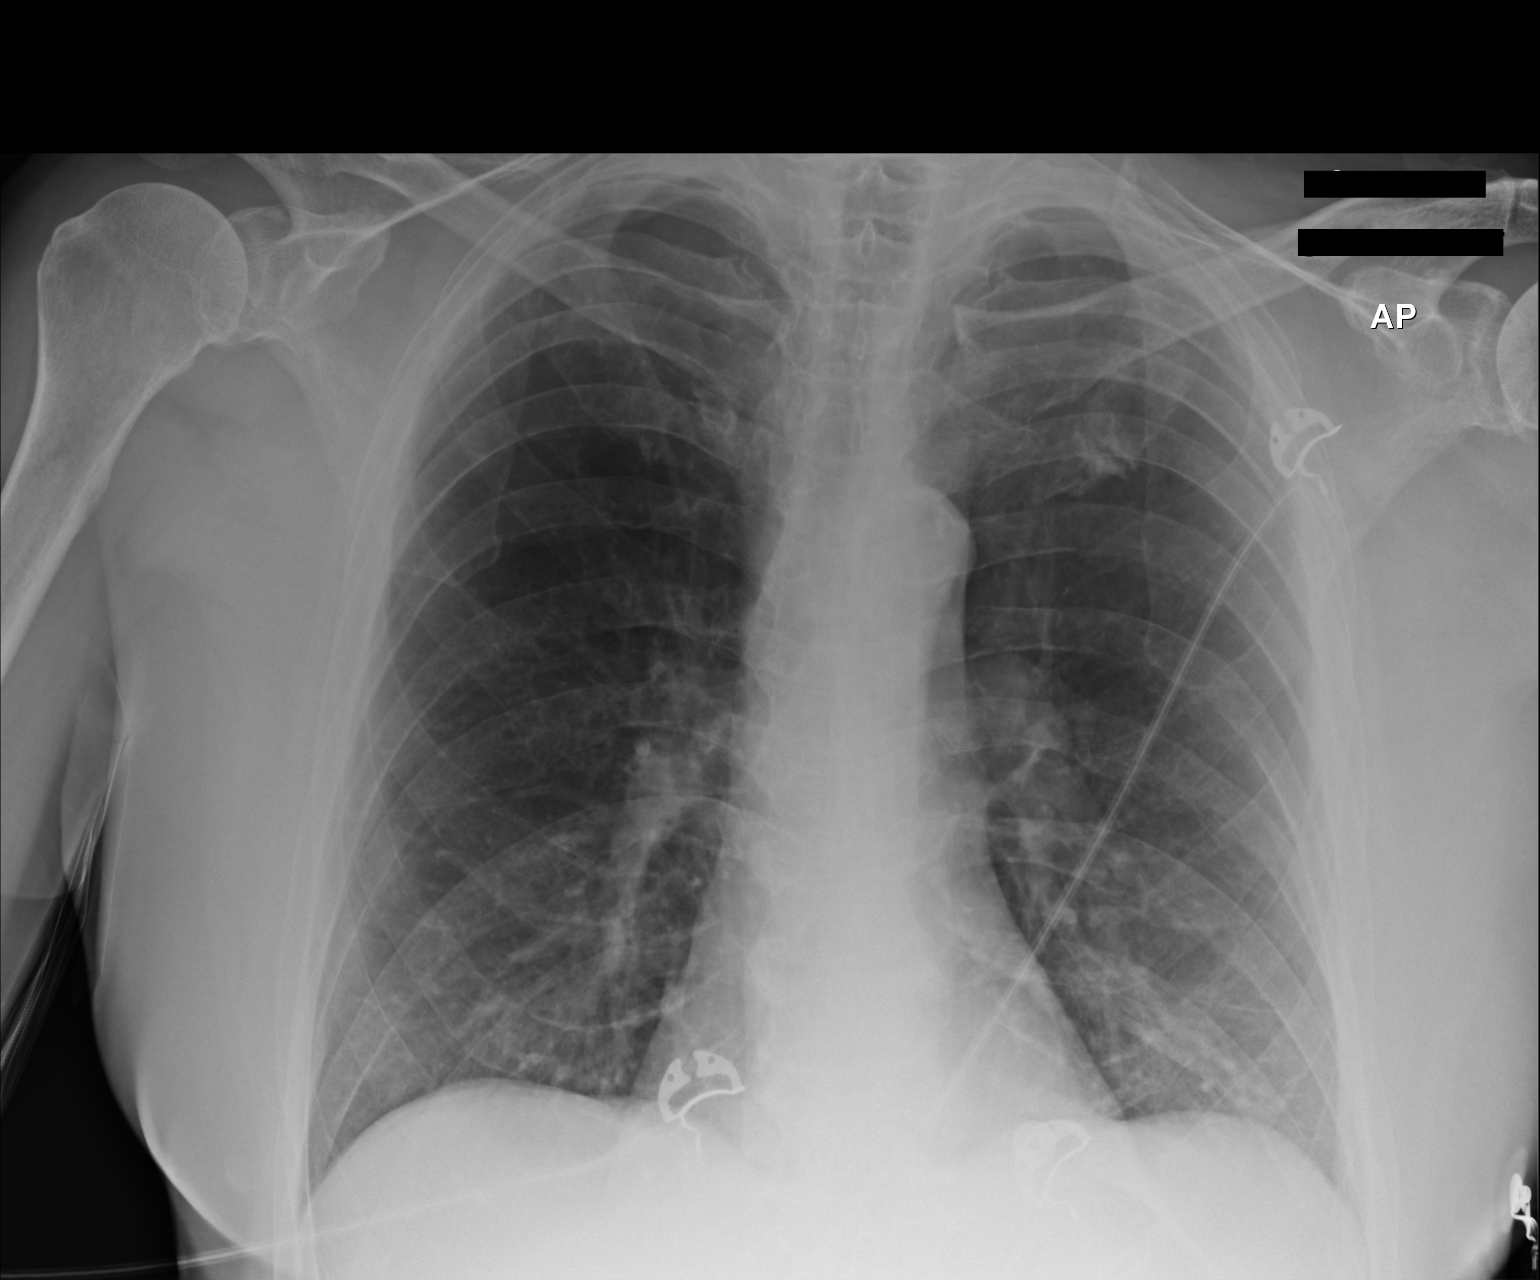

[1 of 1 positions shown; findings below may reference images not displayed]

FINDINGS: Hyperinflation and apical lucency. There is no edema, consolidation,
effusion, or pneumothorax. Normal heart size and aortic contours. No
gross source for hemoptysis. No gross alveolar hemorrhage.
IMPRESSION: COPD without acute superimposed disease.

## 2015-12-23 IMAGING — CT CT ABD-PELV W/ CM
2 of 5 series · 11 of 46 positions shown, 12 images · IV contrast (Iodine)
Comparison: 11/14/2008

CLINICAL DATA: Hematemesis. Gastric varices identified at
endoscopy.

EXAM:
CT ABDOMEN AND PELVIS WITH CONTRAST
TECHNIQUE: Multidetector CT imaging of the abdomen and pelvis was performed
using the standard protocol following bolus administration of
intravenous contrast.
CONTRAST:  100mL OMNIPAQUE IOHEXOL 300 MG/ML  SOLN

[Series 201: routine, idose (2) · axial · 0.82mm/px · z∈[+103,+463]mm · 8 of 94 slices shown, 9 images]
[im 11/94  soft-tissue]
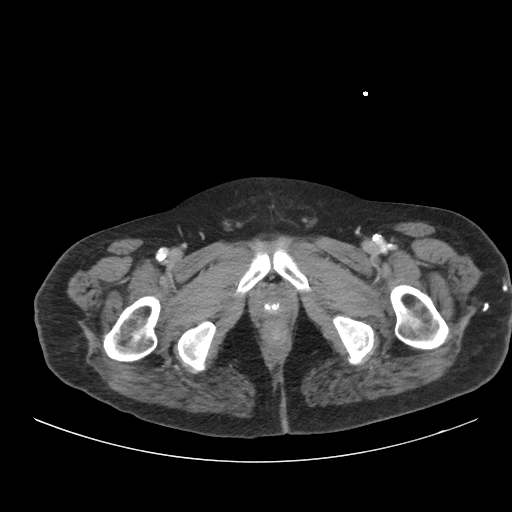
[im 11/94  bone]
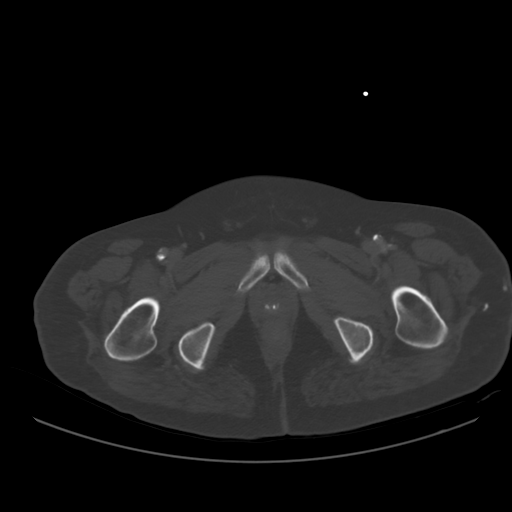
[im 21/94  soft-tissue]
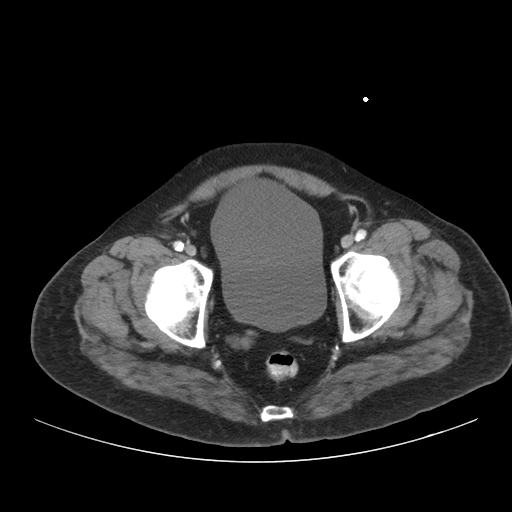
[im 32/94  soft-tissue]
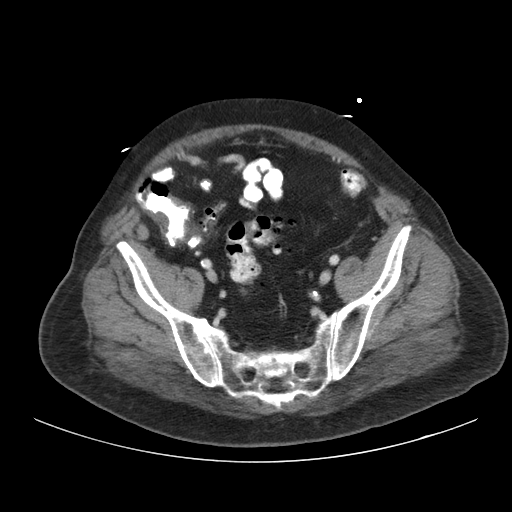
[im 42/94  soft-tissue]
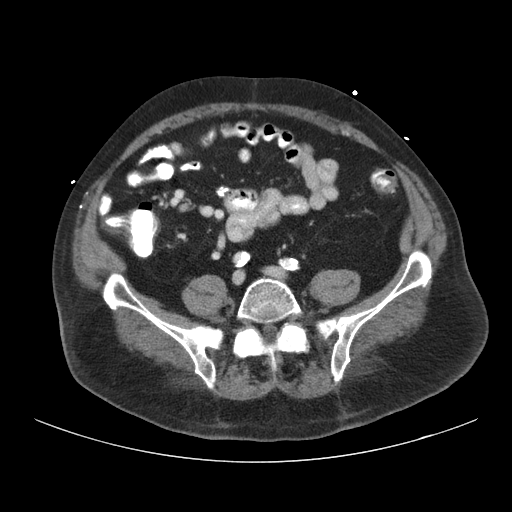
[im 52/94  soft-tissue]
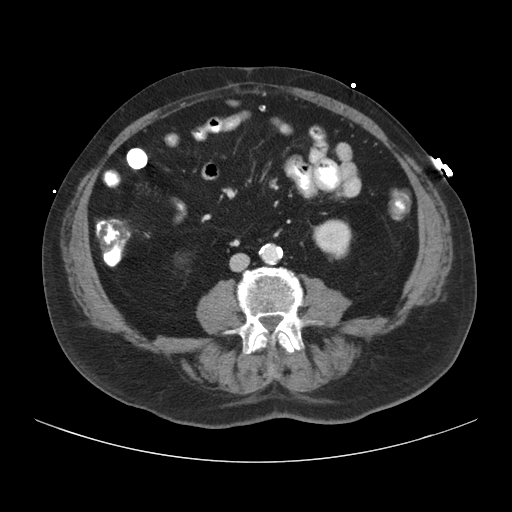
[im 63/94  soft-tissue]
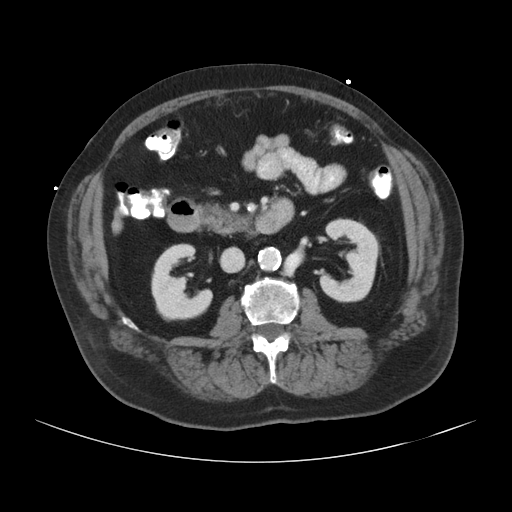
[im 73/94  soft-tissue]
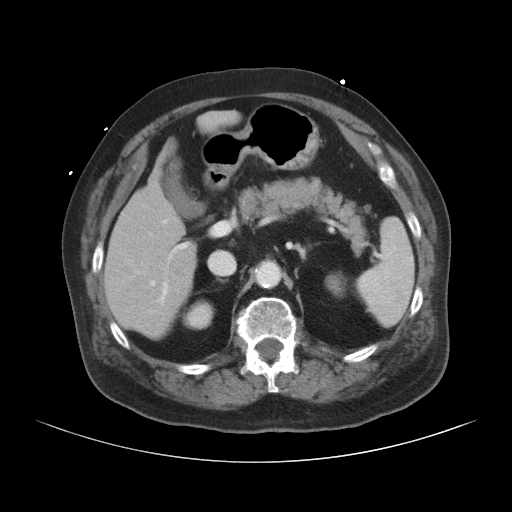
[im 83/94  soft-tissue]
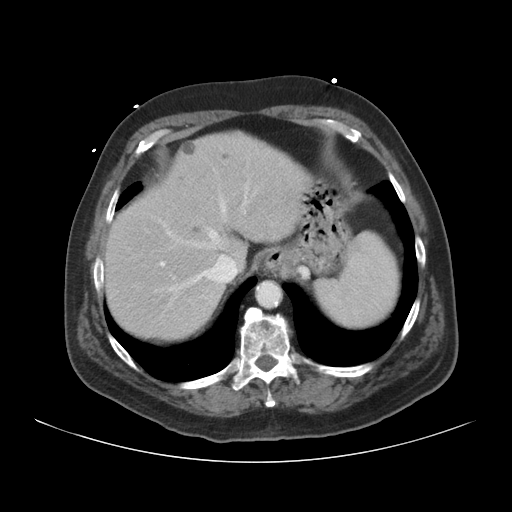

[Series 202: coronals, idose (2) · coronal · 0.50mm/px · 3 of 139 slices shown]
[im 47/139  soft-tissue]
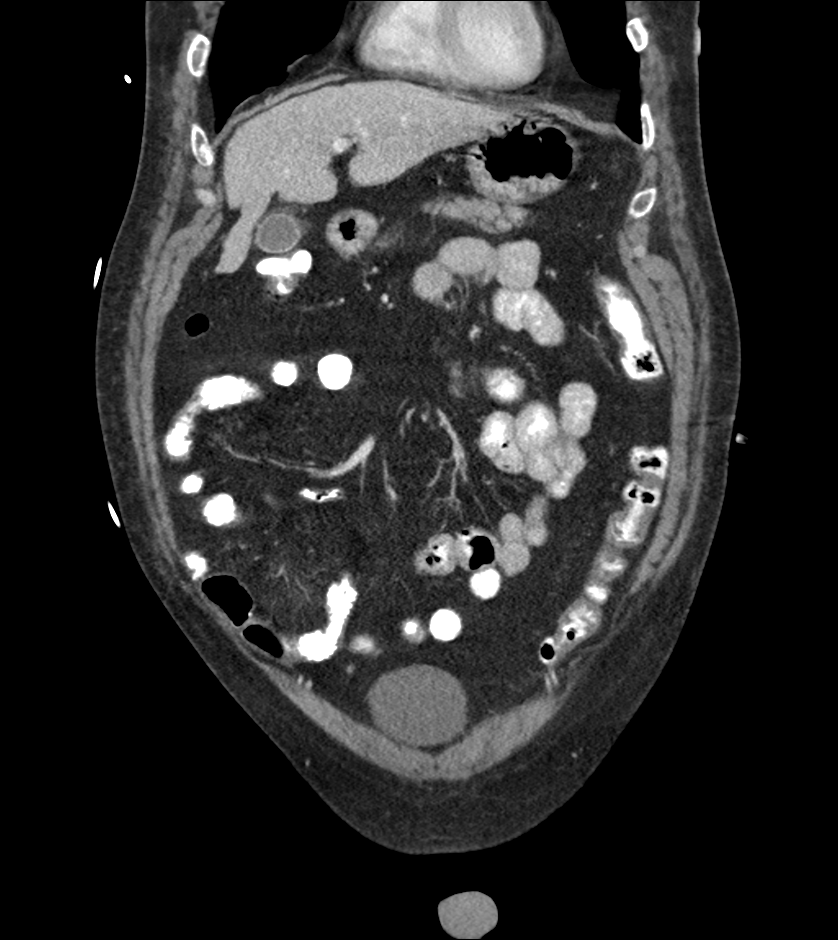
[im 62/139  soft-tissue]
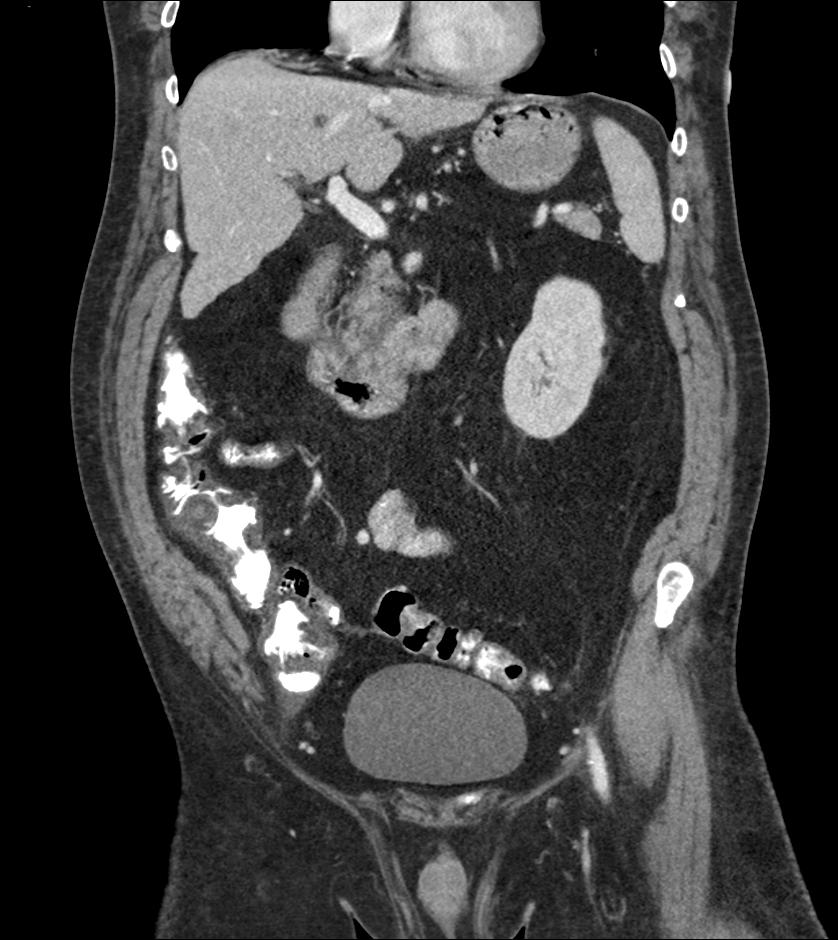
[im 77/139  soft-tissue]
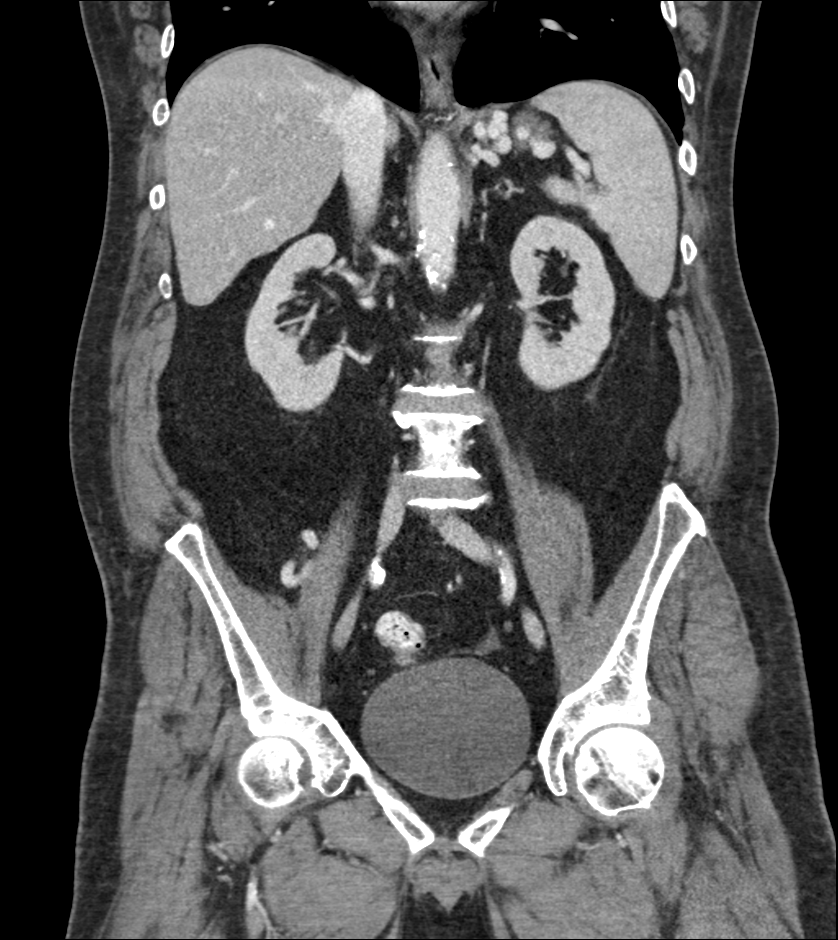

[11 of 46 positions shown; findings below may reference images not displayed]

FINDINGS: Lower chest: No pleural effusion identified. The lung bases are
clear.

Hepatobiliary: Low-attenuation foci are identified within the liver
compatible with liver cysts. The liver has a progressive nodular
contour or with hypertrophy of the lateral segment and caudate lobe.
Gallstone noted. No biliary dilatation.

Pancreas: Normal appearance of the pancreas.

Spleen: No focal splenic abnormality. The spleen measures 11 cm in
length.

Adrenals/Urinary Tract: The adrenal glands are both normal. Several
right renal calculi a noted. The largest is in the inferior pole
measuring 5 mm. Several small cysts noted within the inferior pole
of right kidney. The left kidney is normal. The urinary bladder is
unremarkable.

Stomach/Bowel: Prominent gastric varices intent the posterior aspect
of the gastric fundus, image 14 of series 201. The stomach is
otherwise unremarkable. The small bowel loops have a normal course
and caliber. Multiple distal colonic diverticula are noted. No acute
inflammation

Vascular/Lymphatic: Calcified atherosclerotic disease involves the
abdominal aorta. No aneurysm. The portal vein appears patent. The
splenic vein is also patent.

Reproductive: Prostate gland and seminal vesicles are unremarkable.

Other: There is no ascites or focal fluid collections within the
abdomen or pelvis.

Musculoskeletal: Review of the visualized osseous structures is
negative for aggressive lytic or sclerotic bone lesion.
IMPRESSION: 1. Morphologic features of liver compatible with cirrhosis.
2. Proximal gastric varices which correspond with the findings
identified at endoscopy.
3. Nephrolithiasis
4. Atherosclerotic disease
5. Liver cysts.

## 2015-12-24 IMAGING — CR DG CHEST 1V PORT
1 series · 1 of 1 positions shown · non-contrast
Comparison: 06/29/2014

CLINICAL DATA: Endotracheal tube placement. Central line placement.
Respiratory distress.

EXAM:
PORTABLE CHEST - 1 VIEW

[AP]
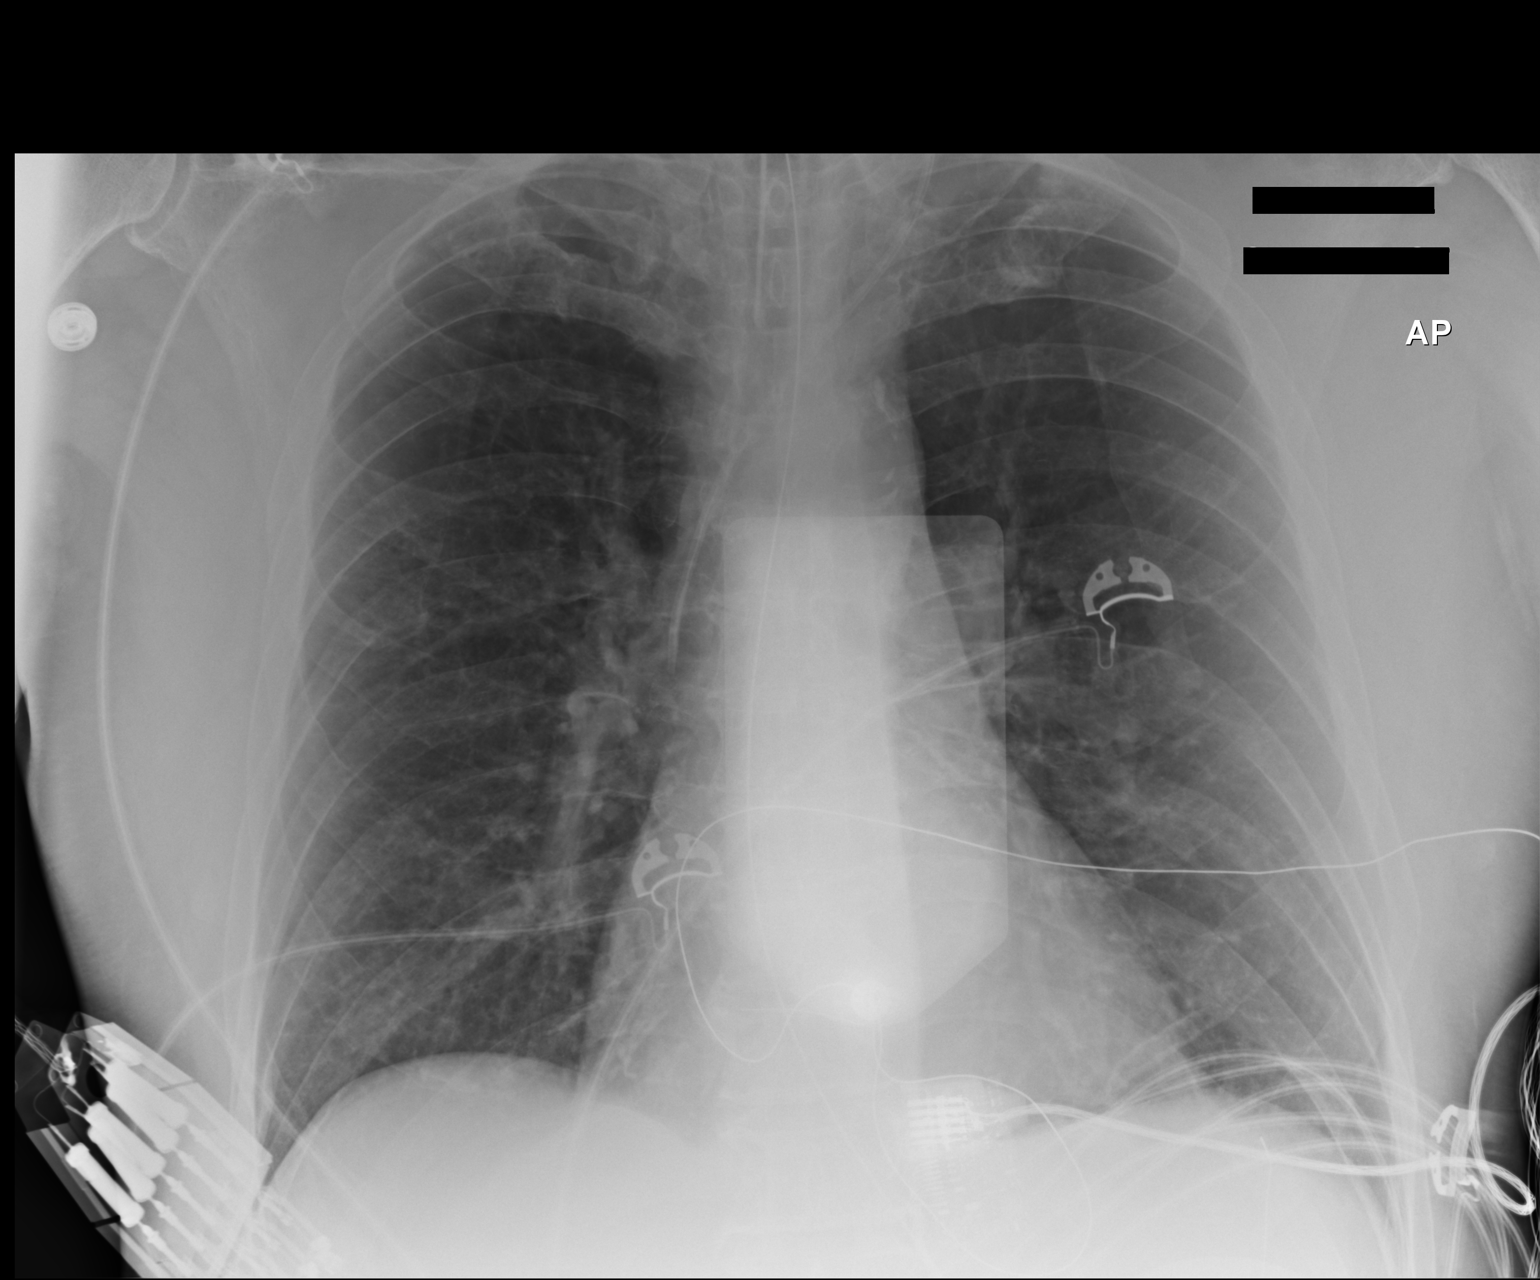

[1 of 1 positions shown; findings below may reference images not displayed]

FINDINGS: Left IJ line tip: SVC. Endotracheal tube satisfactorily position
with tip 6 cm above the carina. Nasogastric tube enters the stomach.

The lungs appear clear. Atherosclerotic aortic arch. Heart size
within normal limits.
IMPRESSION: 1. Interval intubation with tubes and lines satisfactorily position.
